# Patient Record
Sex: Female | Born: 1960 | Race: White | Hispanic: No | Marital: Married | Smoking: Former smoker
Health system: Southern US, Community
[De-identification: ages and names within clinical notes are randomized; demographics above are authoritative.]

## PROBLEM LIST (undated history)

## (undated) DIAGNOSIS — E079 Disorder of thyroid, unspecified: Secondary | ICD-10-CM

## (undated) DIAGNOSIS — F419 Anxiety disorder, unspecified: Secondary | ICD-10-CM

## (undated) DIAGNOSIS — R112 Nausea with vomiting, unspecified: Secondary | ICD-10-CM

## (undated) DIAGNOSIS — G4730 Sleep apnea, unspecified: Secondary | ICD-10-CM

## (undated) DIAGNOSIS — I10 Essential (primary) hypertension: Secondary | ICD-10-CM

## (undated) DIAGNOSIS — E039 Hypothyroidism, unspecified: Secondary | ICD-10-CM

## (undated) DIAGNOSIS — K219 Gastro-esophageal reflux disease without esophagitis: Secondary | ICD-10-CM

## (undated) DIAGNOSIS — J45909 Unspecified asthma, uncomplicated: Secondary | ICD-10-CM

## (undated) DIAGNOSIS — Z9889 Other specified postprocedural states: Secondary | ICD-10-CM

## (undated) HISTORY — PX: EYE SURGERY: SHX253

## (undated) HISTORY — DX: Disorder of thyroid, unspecified: E07.9

## (undated) HISTORY — PX: ROTATOR CUFF REPAIR: SHX139

## (undated) HISTORY — PX: ABDOMINAL HYSTERECTOMY: SHX81

## (undated) HISTORY — PX: TOTAL KNEE ARTHROPLASTY: SHX125

## (undated) HISTORY — PX: CHOLECYSTECTOMY: SHX55

## (undated) HISTORY — PX: JOINT REPLACEMENT: SHX530

## (undated) HISTORY — PX: COLONOSCOPY: SHX174

## (undated) HISTORY — PX: CARPAL TUNNEL RELEASE: SHX101

---

## 2004-08-25 HISTORY — PX: NASAL SINUS SURGERY: SHX719

## 2005-08-25 HISTORY — PX: OSTEOTOMY: SHX137

## 2017-12-29 ENCOUNTER — Other Ambulatory Visit: Payer: Self-pay

## 2017-12-29 ENCOUNTER — Encounter (HOSPITAL_BASED_OUTPATIENT_CLINIC_OR_DEPARTMENT_OTHER): Payer: Self-pay | Admitting: Emergency Medicine

## 2017-12-29 ENCOUNTER — Emergency Department (HOSPITAL_BASED_OUTPATIENT_CLINIC_OR_DEPARTMENT_OTHER)

## 2017-12-29 ENCOUNTER — Emergency Department (HOSPITAL_BASED_OUTPATIENT_CLINIC_OR_DEPARTMENT_OTHER)
Admission: EM | Admit: 2017-12-29 | Discharge: 2017-12-29 | Disposition: A | Discharge status: Home / Self Care | Attending: Emergency Medicine | Admitting: Emergency Medicine

## 2017-12-29 DIAGNOSIS — F419 Anxiety disorder, unspecified: Secondary | ICD-10-CM | POA: Diagnosis not present

## 2017-12-29 DIAGNOSIS — Z9049 Acquired absence of other specified parts of digestive tract: Secondary | ICD-10-CM | POA: Diagnosis not present

## 2017-12-29 DIAGNOSIS — R519 Headache, unspecified: Secondary | ICD-10-CM

## 2017-12-29 DIAGNOSIS — R51 Headache: Secondary | ICD-10-CM | POA: Insufficient documentation

## 2017-12-29 DIAGNOSIS — J392 Other diseases of pharynx: Secondary | ICD-10-CM

## 2017-12-29 DIAGNOSIS — R070 Pain in throat: Secondary | ICD-10-CM | POA: Diagnosis not present

## 2017-12-29 DIAGNOSIS — R112 Nausea with vomiting, unspecified: Secondary | ICD-10-CM | POA: Diagnosis present

## 2017-12-29 DIAGNOSIS — I10 Essential (primary) hypertension: Secondary | ICD-10-CM | POA: Insufficient documentation

## 2017-12-29 DIAGNOSIS — Z77098 Contact with and (suspected) exposure to other hazardous, chiefly nonmedicinal, chemicals: Secondary | ICD-10-CM | POA: Diagnosis not present

## 2017-12-29 DIAGNOSIS — J45909 Unspecified asthma, uncomplicated: Secondary | ICD-10-CM | POA: Insufficient documentation

## 2017-12-29 HISTORY — DX: Essential (primary) hypertension: I10

## 2017-12-29 HISTORY — DX: Unspecified asthma, uncomplicated: J45.909

## 2017-12-29 HISTORY — DX: Anxiety disorder, unspecified: F41.9

## 2017-12-29 MED ORDER — METOCLOPRAMIDE HCL 5 MG/ML IJ SOLN
10.0000 mg | Freq: Once | INTRAMUSCULAR | Status: AC
  Administered 2017-12-29: 10 mg via INTRAVENOUS
  Filled 2017-12-29: qty 2

## 2017-12-29 MED ORDER — DEXAMETHASONE SODIUM PHOSPHATE 10 MG/ML IJ SOLN
10.0000 mg | Freq: Once | INTRAMUSCULAR | Status: AC
  Administered 2017-12-29: 10 mg via INTRAVENOUS
  Filled 2017-12-29: qty 1

## 2017-12-29 MED ORDER — KETOROLAC TROMETHAMINE 30 MG/ML IJ SOLN
30.0000 mg | Freq: Once | INTRAMUSCULAR | Status: AC
Start: 2017-12-29 — End: 2017-12-29
  Administered 2017-12-29: 30 mg via INTRAVENOUS
  Filled 2017-12-29: qty 1

## 2017-12-29 MED ORDER — SODIUM CHLORIDE 0.9 % IV BOLUS
1000.0000 mL | Freq: Once | INTRAVENOUS | Status: AC
  Administered 2017-12-29: 1000 mL via INTRAVENOUS

## 2017-12-29 MED ORDER — DIPHENHYDRAMINE HCL 50 MG/ML IJ SOLN
25.0000 mg | Freq: Once | INTRAMUSCULAR | Status: AC
  Administered 2017-12-29: 25 mg via INTRAVENOUS
  Filled 2017-12-29: qty 1

## 2017-12-29 NOTE — ED Notes (Signed)
IV d/c tip intact, right a/c, pt tolerated well.

## 2017-12-29 NOTE — ED Provider Notes (Signed)
Received patient in signout from Dr. Preston Fleeting.  Briefly the patient is a 57 year old female with a chief complaint of an exposure to a cleaning product with an active ingredient of bleach.  She has a chest x-ray that is negative for concerning findings is oxygenating well.  She was given a headache cocktail with improvement of her symptoms.  She is requesting to be discharged home.  PCP follow-up.   Melene Plan, DO 12/29/17 [phone]

## 2017-12-29 NOTE — ED Provider Notes (Signed)
[facility] EMERGENCY DEPARTMENT Provider Note   CSN: [id number] Arrival date & time: 12/29/17  0545     History   Chief Complaint No chief complaint on file.   HPI Emma [name] is a 57 y.o. female.  The history is provided by the patient.  She has a history of hypertension, asthma, anxiety and comes in complaining of a reaction to using kaboom with bleach.  She states that yesterday morning, she was cleaning her bathroom using kaboom with bleach when she started to get lightheaded and developed nausea.  She did vomit.  As a day is gone on, she has developed pain in her face, ears, throat.  There is a dry cough.  She complains of a global headache.  She continues to have nausea.  She currently rates pain at 9/10.  She has not had anything at home to treat her symptoms.  Past Medical History:  Diagnosis Date  . Anxiety   . Asthma   . Hypertension     There are no active problems to display for this patient.   Past Surgical History:  Procedure Laterality Date  . ABDOMINAL HYSTERECTOMY    . CHOLECYSTECTOMY    . JOINT REPLACEMENT       OB History   None      Home Medications    Prior to Admission medications   Not on File    Family History No family history on file.  Social History Social History   Tobacco Use  . Smoking status: Never Smoker  . Smokeless tobacco: Never Used  Substance Use Topics  . Alcohol use: Never    Frequency: Never  . Drug use: Never     Allergies   Codeine   Review of Systems Review of Systems  All other systems reviewed and are negative.    Physical Exam Updated Vital Signs BP 100/64   Pulse (!) 103   Temp 99.7 F (37.6 C) (Oral)   Resp (!) 22   Ht  (1.575 m)   Wt 79.4 kg (175 lb)   SpO2 97%   BMI 32.01 kg/m   Physical Exam  Nursing note and vitals reviewed.  57 year old female, resting comfortably and in no acute distress. Vital signs are significant for mildly elevated heart rate and  respiratory rate. Oxygen saturation is 97%, which is normal. Head is normocephalic and atraumatic. PERRLA, EOMI. Oropharynx is clear.  There is no pooling of secretions.  Phonation is normal.  No edema of uvula or soft palate or tongue.  Slight swelling of the soft tissues of the face is noted. Neck is nontender and supple without adenopathy or JVD. Back is nontender and there is no CVA tenderness. Lungs are clear without rales, wheezes, or rhonchi. Chest is nontender. Heart has regular rate and rhythm without murmur. Abdomen is soft, flat, nontender without masses or hepatosplenomegaly and peristalsis is normoactive. Extremities have no cyanosis or edema, full range of motion is present. Skin is warm and dry without rash. Neurologic: Mental status is normal, cranial nerves are intact, there are no motor or sensory deficits.  ED Treatments / Results  Labs (all labs ordered are listed, but only abnormal results are displayed) Labs Reviewed - No data to display  EKG None  Radiology No results found.  Procedures Procedures   Medications Ordered in ED Medications  ketorolac (TORADOL) 30 MG/ML injection 30 mg (has no administration in time range)  metoCLOPramide (REGLAN) injection 10 mg (has no administration  in time range)  diphenhydrAMINE (BENADRYL) injection 25 mg (has no administration in time range)  dexamethasone (DECADRON) injection 10 mg (has no administration in time range)  sodium chloride 0.9 % bolus 1,000 mL (has no administration in time range)     Initial Impression / Assessment and Plan / ED Course  I have reviewed the triage vital signs and the nursing notes.  Pertinent labs & imaging results that were available during my care of the patient were reviewed by me and considered in my medical decision making (see chart for details).  Exposure to toxic fumes with bleach being the most likely agent causing difficulty.  Evidence of dermatitis on exam.  Old records are  reviewed, and she has no relevant past visits.  We will give IV fluids, ondansetron for nausea, metoclopramide, diphenhydramine, dexamethasone.  Chest x-ray is obtained to rule out pneumonia.  Old records are reviewed, and she has no relevant past visits.  6:53 AM Chest x-ray shows no obvious infiltrate per my reading, radiology interpretation pending.  Patient is just getting her medications now.  Case is signed out to Dr. Adela Lank.  Final Clinical Impressions(s) / ED Diagnoses   Final diagnoses:  Accidental exposure to bleach  Pain, head and face  Throat irritation  Bad headache    ED Discharge Orders    None       Dione Booze, MD 12/29/17 2229

## 2017-12-29 NOTE — Discharge Instructions (Signed)
Take 4 over the counter ibuprofen tablets 3 times a day or 2 over-the-counter naproxen tablets twice a day for pain. Also take tylenol 1000mg(2 extra strength) four times a day.    

## 2017-12-29 NOTE — ED Triage Notes (Addendum)
Was cleaning the tub, yesterday am , around 0900 and was cleaning with Kaboom and inhaled the fumes . Feels like she is burning in her ears, throat and H/A .

## 2019-03-03 NOTE — H&P (Signed)
[facility] [address] [place], [place] [address] [phone]  A Division of [facility]  RE: Emma [name], [name]                                  [id number]         DOB: 15-Sep-1960 02/28/2019  Reason for visit:  Followup left shoulder pain.    HPI: She suffered a fall in September and has had persistent pain.  She has had soreness around her left shoulder.  She has a history of right shoulder arthroscopy that had a surprise rotator cuff tear.  She has tried multiple injections in the left shoulder, as well as home exercise program.  She has not found any relief.    OBJECTIVE: The patient is a well appearing female, in no apparent distress.  Tenderness at her distal clavicle, as well as over her biceps tendon.    IMAGES: On MRI she has type III acromion with edema in the subacromial bursa and some tendinosis in this region as well.  No obvious rotator cuff tearing.  Some signal at her [facility] joint and around her biceps tendon.    ASSESSMENT/PLAN:  Left shoulder pain, recalcitrant to conservative measures.  We discussed continued physical therapy versus arthroscopic subacromial decompression, distal clavicle excision, exam of rotator cuff with possible repair and biceps tenodesis.  She verbalizes understanding and would like to proceed with surgery.   Ernesta Amble.  Percell Miller, M.D.  Electronically verified by Ernesta Amble. Percell Miller, M.D. TDM:pmw D 03/01/19 T 03/02/19

## 2019-03-22 ENCOUNTER — Ambulatory Visit: Admitting: Licensed Clinical Social Worker

## 2019-03-24 ENCOUNTER — Other Ambulatory Visit: Payer: Self-pay

## 2019-03-24 ENCOUNTER — Encounter (HOSPITAL_BASED_OUTPATIENT_CLINIC_OR_DEPARTMENT_OTHER): Payer: Self-pay | Admitting: *Deleted

## 2019-03-29 ENCOUNTER — Other Ambulatory Visit (HOSPITAL_COMMUNITY)
Admission: RE | Admit: 2019-03-29 | Discharge: 2019-03-29 | Disposition: A | Discharge status: Home / Self Care | Source: Ambulatory Visit | Attending: Orthopedic Surgery | Admitting: Orthopedic Surgery

## 2019-03-29 ENCOUNTER — Other Ambulatory Visit: Payer: Self-pay

## 2019-03-29 ENCOUNTER — Encounter (HOSPITAL_BASED_OUTPATIENT_CLINIC_OR_DEPARTMENT_OTHER)
Admission: RE | Admit: 2019-03-29 | Discharge: 2019-03-29 | Disposition: A | Discharge status: Home / Self Care | Source: Ambulatory Visit | Attending: Orthopedic Surgery | Admitting: Orthopedic Surgery

## 2019-03-29 DIAGNOSIS — I10 Essential (primary) hypertension: Secondary | ICD-10-CM | POA: Diagnosis not present

## 2019-03-29 DIAGNOSIS — W19XXXA Unspecified fall, initial encounter: Secondary | ICD-10-CM | POA: Diagnosis not present

## 2019-03-29 DIAGNOSIS — K219 Gastro-esophageal reflux disease without esophagitis: Secondary | ICD-10-CM | POA: Diagnosis not present

## 2019-03-29 DIAGNOSIS — M24112 Other articular cartilage disorders, left shoulder: Secondary | ICD-10-CM | POA: Diagnosis not present

## 2019-03-29 DIAGNOSIS — Z01812 Encounter for preprocedural laboratory examination: Secondary | ICD-10-CM | POA: Diagnosis not present

## 2019-03-29 DIAGNOSIS — Z01810 Encounter for preprocedural cardiovascular examination: Secondary | ICD-10-CM | POA: Diagnosis not present

## 2019-03-29 DIAGNOSIS — M75122 Complete rotator cuff tear or rupture of left shoulder, not specified as traumatic: Secondary | ICD-10-CM | POA: Insufficient documentation

## 2019-03-29 DIAGNOSIS — M19012 Primary osteoarthritis, left shoulder: Secondary | ICD-10-CM | POA: Insufficient documentation

## 2019-03-29 DIAGNOSIS — Z20828 Contact with and (suspected) exposure to other viral communicable diseases: Secondary | ICD-10-CM | POA: Insufficient documentation

## 2019-03-29 DIAGNOSIS — F419 Anxiety disorder, unspecified: Secondary | ICD-10-CM | POA: Diagnosis not present

## 2019-03-29 DIAGNOSIS — J45909 Unspecified asthma, uncomplicated: Secondary | ICD-10-CM | POA: Diagnosis not present

## 2019-03-29 DIAGNOSIS — Z01818 Encounter for other preprocedural examination: Secondary | ICD-10-CM | POA: Insufficient documentation

## 2019-03-29 DIAGNOSIS — S46012A Strain of muscle(s) and tendon(s) of the rotator cuff of left shoulder, initial encounter: Secondary | ICD-10-CM | POA: Diagnosis not present

## 2019-03-29 DIAGNOSIS — Z79899 Other long term (current) drug therapy: Secondary | ICD-10-CM | POA: Diagnosis not present

## 2019-03-29 LAB — BASIC METABOLIC PANEL
Anion gap: 11 (ref 5–15)
BUN: 12 mg/dL (ref 6–20)
CO2: 26 mmol/L (ref 22–32)
Calcium: 9.4 mg/dL (ref 8.9–10.3)
Chloride: 97 mmol/L — ABNORMAL LOW (ref 98–111)
Creatinine, Ser: 0.69 mg/dL (ref 0.44–1.00)
GFR calc Af Amer: >60 mL/min (ref >60)
GFR calc non Af Amer: >60 mL/min (ref >60)
Glucose, Bld: 94 mg/dL (ref 70–99)
Potassium: 3.9 mmol/L (ref 3.5–5.1)
Sodium: 134 mmol/L — ABNORMAL LOW (ref 135–145)

## 2019-03-29 LAB — SARS CORONAVIRUS 2 (TAT 6-24 HRS): SARS Coronavirus 2: NEGATIVE

## 2019-03-29 NOTE — Progress Notes (Signed)

## 2019-04-01 ENCOUNTER — Encounter (HOSPITAL_BASED_OUTPATIENT_CLINIC_OR_DEPARTMENT_OTHER): Payer: Self-pay | Admitting: *Deleted

## 2019-04-01 ENCOUNTER — Encounter (HOSPITAL_BASED_OUTPATIENT_CLINIC_OR_DEPARTMENT_OTHER)
Admission: RE | Disposition: A | Discharge status: Home / Self Care | Payer: Self-pay | Source: Home / Self Care | Attending: Orthopedic Surgery

## 2019-04-01 ENCOUNTER — Ambulatory Visit (HOSPITAL_BASED_OUTPATIENT_CLINIC_OR_DEPARTMENT_OTHER): Admitting: Anesthesiology

## 2019-04-01 ENCOUNTER — Ambulatory Visit (HOSPITAL_BASED_OUTPATIENT_CLINIC_OR_DEPARTMENT_OTHER)
Admission: RE | Admit: 2019-04-01 | Discharge: 2019-04-01 | Disposition: A | Discharge status: Home / Self Care | Attending: Orthopedic Surgery | Admitting: Orthopedic Surgery

## 2019-04-01 ENCOUNTER — Other Ambulatory Visit: Payer: Self-pay

## 2019-04-01 DIAGNOSIS — W19XXXA Unspecified fall, initial encounter: Secondary | ICD-10-CM | POA: Insufficient documentation

## 2019-04-01 DIAGNOSIS — S46012A Strain of muscle(s) and tendon(s) of the rotator cuff of left shoulder, initial encounter: Secondary | ICD-10-CM | POA: Insufficient documentation

## 2019-04-01 DIAGNOSIS — J45909 Unspecified asthma, uncomplicated: Secondary | ICD-10-CM | POA: Insufficient documentation

## 2019-04-01 DIAGNOSIS — Z79899 Other long term (current) drug therapy: Secondary | ICD-10-CM | POA: Insufficient documentation

## 2019-04-01 DIAGNOSIS — M75112 Incomplete rotator cuff tear or rupture of left shoulder, not specified as traumatic: Secondary | ICD-10-CM

## 2019-04-01 DIAGNOSIS — Z01810 Encounter for preprocedural cardiovascular examination: Secondary | ICD-10-CM | POA: Insufficient documentation

## 2019-04-01 DIAGNOSIS — F419 Anxiety disorder, unspecified: Secondary | ICD-10-CM | POA: Insufficient documentation

## 2019-04-01 DIAGNOSIS — I10 Essential (primary) hypertension: Secondary | ICD-10-CM | POA: Insufficient documentation

## 2019-04-01 DIAGNOSIS — Z01812 Encounter for preprocedural laboratory examination: Secondary | ICD-10-CM | POA: Insufficient documentation

## 2019-04-01 DIAGNOSIS — M19012 Primary osteoarthritis, left shoulder: Secondary | ICD-10-CM | POA: Insufficient documentation

## 2019-04-01 DIAGNOSIS — K219 Gastro-esophageal reflux disease without esophagitis: Secondary | ICD-10-CM | POA: Insufficient documentation

## 2019-04-01 DIAGNOSIS — Z20828 Contact with and (suspected) exposure to other viral communicable diseases: Secondary | ICD-10-CM | POA: Insufficient documentation

## 2019-04-01 DIAGNOSIS — M24112 Other articular cartilage disorders, left shoulder: Secondary | ICD-10-CM | POA: Insufficient documentation

## 2019-04-01 HISTORY — PX: SHOULDER ARTHROSCOPY WITH SUBACROMIAL DECOMPRESSION, ROTATOR CUFF REPAIR AND BICEP TENDON REPAIR: SHX5687

## 2019-04-01 HISTORY — DX: Nausea with vomiting, unspecified: R11.2

## 2019-04-01 HISTORY — DX: Sleep apnea, unspecified: G47.30

## 2019-04-01 HISTORY — DX: Gastro-esophageal reflux disease without esophagitis: K21.9

## 2019-04-01 HISTORY — DX: Other specified postprocedural states: Z98.890

## 2019-04-01 SURGERY — SHOULDER ARTHROSCOPY WITH SUBACROMIAL DECOMPRESSION, ROTATOR CUFF REPAIR AND BICEP TENDON REPAIR
Anesthesia: General | Site: Shoulder | Laterality: Left

## 2019-04-01 MED ORDER — FENTANYL CITRATE (PF) 100 MCG/2ML IJ SOLN: 25.0000 ug | INTRAMUSCULAR | Status: DC | PRN

## 2019-04-01 MED ORDER — CEFAZOLIN SODIUM-DEXTROSE 2-4 GM/100ML-% IV SOLN
2.0000 g | INTRAVENOUS | Status: AC
Start: 2019-04-01 — End: 2019-04-01
  Administered 2019-04-01: 2 g via INTRAVENOUS

## 2019-04-01 MED ORDER — SODIUM CHLORIDE 0.9 % IR SOLN
Status: DC | PRN
  Administered 2019-04-01: 18000 mL

## 2019-04-01 MED ORDER — GABAPENTIN 300 MG PO CAPS
300.0000 mg | ORAL_CAPSULE | Freq: Once | ORAL | Status: AC
  Administered 2019-04-01: 300 mg via ORAL

## 2019-04-01 MED ORDER — ONDANSETRON HCL 4 MG/2ML IJ SOLN
INTRAMUSCULAR | Status: DC | PRN
  Administered 2019-04-01: 4 mg via INTRAVENOUS

## 2019-04-01 MED ORDER — DEXAMETHASONE SODIUM PHOSPHATE 10 MG/ML IJ SOLN
INTRAMUSCULAR | Status: AC
  Filled 2019-04-01: qty 1

## 2019-04-01 MED ORDER — METHYLPREDNISOLONE ACETATE 80 MG/ML IJ SUSP
INTRAMUSCULAR | Status: DC | PRN
  Administered 2019-04-01: 80 mg via INTRA_ARTICULAR

## 2019-04-01 MED ORDER — OXYCODONE HCL 5 MG PO TABS: 5.0000 mg | ORAL_TABLET | ORAL | 0 refills | Status: AC | PRN

## 2019-04-01 MED ORDER — CHLORHEXIDINE GLUCONATE 4 % EX LIQD: 60.0000 mL | Freq: Once | CUTANEOUS | Status: DC

## 2019-04-01 MED ORDER — BUPIVACAINE HCL (PF) 0.25 % IJ SOLN
INTRAMUSCULAR | Status: DC | PRN
  Administered 2019-04-01: 15 mL

## 2019-04-01 MED ORDER — BUPIVACAINE HCL (PF) 0.25 % IJ SOLN
INTRAMUSCULAR | Status: DC | PRN
  Administered 2019-04-01: 5 mL via INTRA_ARTICULAR

## 2019-04-01 MED ORDER — LACTATED RINGERS IV SOLN: INTRAVENOUS | Status: DC

## 2019-04-01 MED ORDER — LIDOCAINE 2% (20 MG/ML) 5 ML SYRINGE
INTRAMUSCULAR | Status: AC
  Filled 2019-04-01: qty 5

## 2019-04-01 MED ORDER — CELECOXIB 400 MG PO CAPS
400.0000 mg | ORAL_CAPSULE | Freq: Once | ORAL | Status: AC
  Administered 2019-04-01: 400 mg via ORAL

## 2019-04-01 MED ORDER — FENTANYL CITRATE (PF) 100 MCG/2ML IJ SOLN
INTRAMUSCULAR | Status: AC
  Filled 2019-04-01: qty 2

## 2019-04-01 MED ORDER — ACETAMINOPHEN 500 MG PO TABS: 1000.0000 mg | ORAL_TABLET | Freq: Once | ORAL | Status: DC

## 2019-04-01 MED ORDER — BUPIVACAINE LIPOSOME 1.3 % IJ SUSP
INTRAMUSCULAR | Status: DC | PRN
  Administered 2019-04-01: 10 mL via PERINEURAL

## 2019-04-01 MED ORDER — CELECOXIB 200 MG PO CAPS
ORAL_CAPSULE | ORAL | Status: AC
  Filled 2019-04-01: qty 2

## 2019-04-01 MED ORDER — DEXAMETHASONE SODIUM PHOSPHATE 10 MG/ML IJ SOLN
INTRAMUSCULAR | Status: DC | PRN
  Administered 2019-04-01: 10 mg via INTRAVENOUS

## 2019-04-01 MED ORDER — LIDOCAINE 2% (20 MG/ML) 5 ML SYRINGE
INTRAMUSCULAR | Status: DC | PRN
  Administered 2019-04-01: 40 mg via INTRAVENOUS

## 2019-04-01 MED ORDER — SUCCINYLCHOLINE CHLORIDE 200 MG/10ML IV SOSY
PREFILLED_SYRINGE | INTRAVENOUS | Status: DC | PRN
  Administered 2019-04-01: 120 mg via INTRAVENOUS

## 2019-04-01 MED ORDER — ONDANSETRON HCL 4 MG PO TABS: 4.0000 mg | ORAL_TABLET | Freq: Three times a day (TID) | ORAL | 0 refills | Status: DC | PRN

## 2019-04-01 MED ORDER — ONDANSETRON HCL 4 MG/2ML IJ SOLN
INTRAMUSCULAR | Status: AC
  Filled 2019-04-01: qty 2

## 2019-04-01 MED ORDER — ACETAMINOPHEN 500 MG PO TABS: 1000.0000 mg | ORAL_TABLET | Freq: Three times a day (TID) | ORAL | 0 refills | Status: AC

## 2019-04-01 MED ORDER — ACETAMINOPHEN 500 MG PO TABS
1000.0000 mg | ORAL_TABLET | Freq: Once | ORAL | Status: AC
  Administered 2019-04-01: 11:00:00 1000 mg via ORAL

## 2019-04-01 MED ORDER — LACTATED RINGERS IV SOLN
INTRAVENOUS | Status: DC
  Administered 2019-04-01 (×2): via INTRAVENOUS

## 2019-04-01 MED ORDER — GABAPENTIN 300 MG PO CAPS
ORAL_CAPSULE | ORAL | Status: AC
  Filled 2019-04-01: qty 1

## 2019-04-01 MED ORDER — CEFAZOLIN SODIUM-DEXTROSE 2-4 GM/100ML-% IV SOLN
INTRAVENOUS | Status: AC
  Filled 2019-04-01: qty 100

## 2019-04-01 MED ORDER — PROMETHAZINE HCL 25 MG/ML IJ SOLN: 6.2500 mg | INTRAMUSCULAR | Status: DC | PRN

## 2019-04-01 MED ORDER — MIDAZOLAM HCL 2 MG/2ML IJ SOLN
1.0000 mg | INTRAMUSCULAR | Status: DC | PRN
  Administered 2019-04-01: 2 mg via INTRAVENOUS

## 2019-04-01 MED ORDER — METHOCARBAMOL 500 MG PO TABS: 500.0000 mg | ORAL_TABLET | Freq: Three times a day (TID) | ORAL | 0 refills | Status: DC | PRN

## 2019-04-01 MED ORDER — PROPOFOL 10 MG/ML IV BOLUS
INTRAVENOUS | Status: AC
  Filled 2019-04-01: qty 20

## 2019-04-01 MED ORDER — GABAPENTIN 300 MG PO CAPS: 300.0000 mg | ORAL_CAPSULE | Freq: Two times a day (BID) | ORAL | 0 refills | Status: DC

## 2019-04-01 MED ORDER — SUCCINYLCHOLINE CHLORIDE 200 MG/10ML IV SOSY
PREFILLED_SYRINGE | INTRAVENOUS | Status: AC
  Filled 2019-04-01: qty 10

## 2019-04-01 MED ORDER — PHENYLEPHRINE 40 MCG/ML (10ML) SYRINGE FOR IV PUSH (FOR BLOOD PRESSURE SUPPORT)
PREFILLED_SYRINGE | INTRAVENOUS | Status: DC | PRN
  Administered 2019-04-01 (×2): 80 ug via INTRAVENOUS
  Administered 2019-04-01: 40 ug via INTRAVENOUS
  Administered 2019-04-01: 80 ug via INTRAVENOUS
  Administered 2019-04-01: 120 ug via INTRAVENOUS
  Administered 2019-04-01: 80 ug via INTRAVENOUS

## 2019-04-01 MED ORDER — CIPROFLOXACIN IN D5W 400 MG/200ML IV SOLN
INTRAVENOUS | Status: AC
  Filled 2019-04-01: qty 200

## 2019-04-01 MED ORDER — PHENYLEPHRINE 40 MCG/ML (10ML) SYRINGE FOR IV PUSH (FOR BLOOD PRESSURE SUPPORT)
PREFILLED_SYRINGE | INTRAVENOUS | Status: AC
  Filled 2019-04-01: qty 20

## 2019-04-01 MED ORDER — PROPOFOL 10 MG/ML IV BOLUS
INTRAVENOUS | Status: DC | PRN
  Administered 2019-04-01: 200 mg via INTRAVENOUS

## 2019-04-01 MED ORDER — MIDAZOLAM HCL 2 MG/2ML IJ SOLN
INTRAMUSCULAR | Status: AC
  Filled 2019-04-01: qty 2

## 2019-04-01 MED ORDER — FENTANYL CITRATE (PF) 100 MCG/2ML IJ SOLN
100.0000 ug | INTRAMUSCULAR | Status: DC | PRN
  Administered 2019-04-01: 100 ug via INTRAVENOUS

## 2019-04-01 MED ORDER — ACETAMINOPHEN 500 MG PO TABS
ORAL_TABLET | ORAL | Status: AC
  Filled 2019-04-01: qty 2

## 2019-04-01 SURGICAL SUPPLY — 76 items
ANCHOR SUT BIO SW 4.75X19.1 (Anchor) ×2 IMPLANT
ANCHOR SUT BIOCOMP LK 2.9X12.5 (Anchor) ×2 IMPLANT
BLADE EXCALIBUR 4.0MM X 13CM (MISCELLANEOUS)
BLADE EXCALIBUR 4.0X13 (MISCELLANEOUS) IMPLANT
BLADE SURG 15 STRL LF DISP TIS (BLADE) IMPLANT
BLADE SURG 15 STRL SS (BLADE)
BUR OVAL 4.0 (BURR) IMPLANT
BURR OVAL 8 FLU 5.0MM X 13CM (MISCELLANEOUS) ×1
BURR OVAL 8 FLU 5.0X13 (MISCELLANEOUS) ×2 IMPLANT
CANNULA 5.75X71 LONG (CANNULA) IMPLANT
CANNULA TWIST IN 8.25X7CM (CANNULA) ×2 IMPLANT
CHLORAPREP W/TINT 26 (MISCELLANEOUS) ×3 IMPLANT
CLOSURE STERI-STRIP 1/2X4 (GAUZE/BANDAGES/DRESSINGS)
CLSR STERI-STRIP ANTIMIC 1/2X4 (GAUZE/BANDAGES/DRESSINGS) IMPLANT
COVER WAND RF STERILE (DRAPES) IMPLANT
DISSECTOR  3.8MM X 13CM (MISCELLANEOUS) ×2
DISSECTOR 3.8MM X 13CM (MISCELLANEOUS) ×1 IMPLANT
DRAPE IMP U-DRAPE 54X76 (DRAPES) ×3 IMPLANT
DRAPE INCISE IOBAN 66X45 STRL (DRAPES) ×3 IMPLANT
DRAPE SHOULDER BEACH CHAIR (DRAPES) ×3 IMPLANT
DRAPE U-SHAPE 47X51 STRL (DRAPES) ×3 IMPLANT
DRSG EMULSION OIL 3X3 NADH (GAUZE/BANDAGES/DRESSINGS) ×3 IMPLANT
DRSG PAD ABDOMINAL 8X10 ST (GAUZE/BANDAGES/DRESSINGS) ×3 IMPLANT
ELECT REM PT RETURN 9FT ADLT (ELECTROSURGICAL)
ELECTRODE REM PT RTRN 9FT ADLT (ELECTROSURGICAL) IMPLANT
GAUZE SPONGE 4X4 12PLY STRL (GAUZE/BANDAGES/DRESSINGS) ×4 IMPLANT
GLOVE BIO SURGEON STRL SZ7.5 (GLOVE) ×6 IMPLANT
GLOVE BIOGEL PI IND STRL 8 (GLOVE) ×2 IMPLANT
GLOVE BIOGEL PI INDICATOR 8 (GLOVE) ×4
GOWN STRL REUS W/ TWL LRG LVL3 (GOWN DISPOSABLE) ×3 IMPLANT
GOWN STRL REUS W/ TWL XL LVL3 (GOWN DISPOSABLE) IMPLANT
GOWN STRL REUS W/TWL LRG LVL3 (GOWN DISPOSABLE) ×6
GOWN STRL REUS W/TWL XL LVL3 (GOWN DISPOSABLE)
KIT PUSHLOCK 2.9 HIP (KITS) ×2 IMPLANT
MANIFOLD NEPTUNE II (INSTRUMENTS) ×3 IMPLANT
NDL SCORPION MULTI FIRE (NEEDLE) IMPLANT
NDL SUT 6 .5 CRC .975X.05 MAYO (NEEDLE) IMPLANT
NEEDLE MAYO TAPER (NEEDLE)
NEEDLE SCORPION MULTI FIRE (NEEDLE) ×3 IMPLANT
NS IRRIG 1000ML POUR BTL (IV SOLUTION) IMPLANT
PACK ARTHROSCOPY DSU (CUSTOM PROCEDURE TRAY) ×3 IMPLANT
PACK BASIN DAY SURGERY FS (CUSTOM PROCEDURE TRAY) ×3 IMPLANT
PAD ORTHO SHOULDER 7X19 LRG (SOFTGOODS) ×2 IMPLANT
PASSER SUT SWIFTSTITCH HIP CRT (INSTRUMENTS) ×2 IMPLANT
PENCIL BUTTON HOLSTER BLD 10FT (ELECTRODE) IMPLANT
PORT APPOLLO RF 90DEGREE MULTI (SURGICAL WAND) ×3 IMPLANT
RESTRAINT HEAD UNIVERSAL NS (MISCELLANEOUS) ×3 IMPLANT
SLEEVE SCD COMPRESS KNEE MED (MISCELLANEOUS) ×3 IMPLANT
SLING ARM FOAM STRAP LRG (SOFTGOODS) IMPLANT
SLING ARM IMMOBILIZER LRG (SOFTGOODS) IMPLANT
SLING ARM IMMOBILIZER MED (SOFTGOODS) IMPLANT
SLING ARM MED ADULT FOAM STRAP (SOFTGOODS) IMPLANT
SLING ARM XL FOAM STRAP (SOFTGOODS) IMPLANT
SPONGE LAP 4X18 RFD (DISPOSABLE) IMPLANT
SUCTION FRAZIER HANDLE 10FR (MISCELLANEOUS)
SUCTION TUBE FRAZIER 10FR DISP (MISCELLANEOUS) IMPLANT
SUT ETHIBOND 2 OS 4 DA (SUTURE) IMPLANT
SUT ETHILON 2 0 FS 18 (SUTURE) IMPLANT
SUT ETHILON 3 0 PS 1 (SUTURE) ×3 IMPLANT
SUT FIBERWIRE #2 38 T-5 BLUE (SUTURE)
SUT MNCRL AB 4-0 PS2 18 (SUTURE) IMPLANT
SUT TIGER TAPE 7 IN WHITE (SUTURE) IMPLANT
SUT VIC AB 0 CT1 27 (SUTURE)
SUT VIC AB 0 CT1 27XBRD ANBCTR (SUTURE) IMPLANT
SUT VIC AB 2-0 SH 27 (SUTURE)
SUT VIC AB 2-0 SH 27XBRD (SUTURE) IMPLANT
SUT VIC AB 3-0 FS2 27 (SUTURE) IMPLANT
SUTURE FIBERWR #2 38 T-5 BLUE (SUTURE) IMPLANT
SUTURE TAPE TIGERLINK 1.3MM BL (SUTURE) IMPLANT
SUTURETAPE TIGERLINK 1.3MM BL (SUTURE)
TAPE CLOTH SURG 6X10 WHT LF (GAUZE/BANDAGES/DRESSINGS) ×2 IMPLANT
TAPE FIBER 2MM 7IN #2 BLUE (SUTURE) IMPLANT
TOWEL GREEN STERILE FF (TOWEL DISPOSABLE) ×3 IMPLANT
TUBING ARTHROSCOPY IRRIG 16FT (MISCELLANEOUS) ×3 IMPLANT
WATER STERILE IRR 1000ML POUR (IV SOLUTION) ×3 IMPLANT
YANKAUER SUCT BULB TIP NO VENT (SUCTIONS) IMPLANT

## 2019-04-01 NOTE — Anesthesia Postprocedure Evaluation (Signed)
Anesthesia Post Note  Patient: Emma [name]  Procedure(s) Performed: LEFT SHOULDER ARTHROSCOPY WITH SUBACROMIAL DECOMPRESSION, DISTAL CLAVICLE EXCISION, BICEP TENODESIS, ROTATOR CUFF REPAIR, spinal needle decrompression of glenoid cyst (Left Shoulder)     Patient location during evaluation: PACU Anesthesia Type: General Level of consciousness: sedated Pain management: pain level controlled Vital Signs Assessment: post-procedure vital signs reviewed and stable Respiratory status: spontaneous breathing and respiratory function stable Cardiovascular status: stable Postop Assessment: no apparent nausea or vomiting Anesthetic complications: no    Last Vitals:  Vitals:   04/01/19 1500 04/01/19 1551  BP: 118/67 137/79  Pulse: 87 91  Resp: 14 16  Temp:  36.8 C  SpO2: 98% 98%    Last Pain:  Vitals:   04/01/19 1551  PainSc: 0-No pain                 Emmert Roethler DANIEL

## 2019-04-01 NOTE — Op Note (Signed)
04/01/2019  1:43 PM  PATIENT:  Emma PatchLori [name]    PRE-OPERATIVE DIAGNOSIS:  OTHER ARTICULAR CARTILAGE DISORDERS, LEFT SHOULDER, PRIMARY OSTEOARTHRITIS, LEFT SHOULDER, OTHER ARTICULAR CARTILAGE DISORDERS, LEFT SHOULDER COMPLETE ROTATOR CUFF TEAR OR RUPTURE OF LEFT SHOULDER  POST-OPERATIVE DIAGNOSIS:  Same  PROCEDURE:  LEFT SHOULDER ARTHROSCOPY WITH SUBACROMIAL DECOMPRESSION, DISTAL CLAVICLE EXCISION, BICEP TENODESIS, ROTATOR CUFF REPAIR, spinal needle decrompression of glenoid cyst  SURGEON:  Sheral Apleyimothy D [facility], MD  ASSISTANT: Aquilla HackerHenry Martensen, PA-C, he was present and scrubbed throughout the case, critical for completion in a timely fashion, and for retraction, instrumentation, and closure.   ANESTHESIA:   General  PREOPERATIVE INDICATIONS:  Emma [name] is a  58 y.o. female with a diagnosis of OTHER ARTICULAR CARTILAGE DISORDERS, LEFT SHOULDER, PRIMARY OSTEOARTHRITIS, LEFT SHOULDER, OTHER ARTICULAR CARTILAGE DISORDERS, LEFT SHOULDER COMPLETE ROTATOR CUFF TEAR OR RUPTURE OF LEFT SHOULDER who failed conservative measures and elected for surgical management.    The risks benefits and alternatives were discussed with the patient preoperatively including but not limited to the risks of infection, bleeding, nerve injury, cardiopulmonary complications, the need for revision surgery, among others, and the patient was willing to proceed.  OPERATIVE IMPLANTS: arthrex anchors  OPERATIVE FINDINGS: Supra tear, SLAP tear  BLOOD LOSS: minimal  COMPLICATIONS: none  OPERATIVE PROCEDURE:  Patient was identified in the preoperative holding area and site was marked by me He was transported to the operating theater and placed on the table in beach chair position taking care to pad all bony prominences. After a preincinduction time out anesthesia was induced. The left upper extremity was prepped and draped in normal sterile fashion and a pre-incision timeout was performed. Emma [name] received ancef for preoperative  antibiotics.   Initially made a posterior arthroscopic portal and inserted the arthroscope into the glenohumeral joint. tour of the joint demonstrated the above operative findings  I created an anterior portal just lateral to the coracoid under direct visualization using a spinal needle.  I performed an extensive debridement of the scarred synovial tissue and remaining structures   I used a combination of biter and shaver to release the biceps tendon from the superior labrum and then used the shaver to debride the superior labrum to a smooth rim. I performed a L&T tenodesis  I then introduced the arthroscope into the subacromial space and brought the shaver into the anterior portal. I debrided the bursa for appropriate visualization.  I then performed a subacromial decompression using combination of the shaver ArthroCare and burr using a cutting block technique. As happy with the final elevation of the subacromial space on multiple portal views.   Next I turned my attention to the distal clavicle and through the anterior portal using the bur and shaver I was able to perform a distal clavicle excision. I then switched portals and inserted the arthroscope into the anterior portal and was happy with an appropriate resection of the distal clavicle.   I debrided the rotator cuff tear and examined its mobility. There was a roughly 6 millimeters tear and I debrided the tear here. I then debrided the footprint to good bone for placement of the tendon. Next I marked the spot for the push locks. I used the scorpion to pass a horizontal mattress stitch through the tendon with even spread. I placed this down to bone with good bite on the SL anchor.  I was happy with the tendon apposition and there was minimal to no dog ear.  Next I removed all arthroscopic equipment expressed  all fluid and closed the portals with a nylon stitch. A sterile dressing was applied the patient was taken the PACU in stable  condition.  POST OPERATIVE PLAN: The patient will be in a sling full-time and keep the dressings clean dry and intact. DVT prophylaxis will consist of early ambulation

## 2019-04-01 NOTE — Transfer of Care (Signed)
Immediate Anesthesia Transfer of Care Note  Patient: Amamda Curbow  Procedure(s) Performed: LEFT SHOULDER ARTHROSCOPY WITH SUBACROMIAL DECOMPRESSION, DISTAL CLAVICLE EXCISION, BICEP TENODESIS, ROTATOR CUFF REPAIR, spinal needle decrompression of glenoid cyst (Left Shoulder)  Patient Location: PACU  Anesthesia Type:GA combined with regional for post-op pain  Level of Consciousness: awake, alert  and oriented  Airway & Oxygen Therapy: Patient Spontanous Breathing and Patient connected to face mask oxygen  Post-op Assessment: Report given to RN and Post -op Vital signs reviewed and stable  Post vital signs: Reviewed and stable  Last Vitals:  Vitals Value Taken Time  BP 134/67 04/01/19 1402  Temp    Pulse 80 04/01/19 1404  Resp 18 04/01/19 1404  SpO2 100 % 04/01/19 1404  Vitals shown include unvalidated device data.  Last Pain:  Vitals:   04/01/19 1043  PainSc: 3          Complications: No apparent anesthesia complications

## 2019-04-01 NOTE — Progress Notes (Signed)
Assisted Dr. Singer with left, ultrasound guided, interscalene  block. Side rails up, monitors on throughout procedure. See vital signs in flow sheet. Tolerated Procedure well.  

## 2019-04-01 NOTE — Interval H&P Note (Signed)
I participated in the care of this patient and agree with the above history, physical and evaluation. I performed a review of the history and a physical exam as detailed   Timothy Daniel Murphy MD  

## 2019-04-01 NOTE — Discharge Instructions (Signed)
Maintain sling until follow up.  Diet: As you were doing prior to hospitalization   Dressing:  Keep dressings on and dry.  You may remove dressings in 3 days and shower over incisions.  No Bath / submerging incisions.  Cover with clean Band-Aid.  Activity:  Increase activity slowly as tolerated, but follow the weight bearing instructions below.  The rules on driving is that you can not be taking narcotics while you drive, and you must feel in control of the vehicle.    Weight Bearing:  Do not bear weight with affected arm.   Pain:  For severe pain, you may increase breakthrough pain medication (oxycodone) for the first few days post op to 2 tablets every 4 hours.  Stop this medication as soon as you are able.  Constipation: Narcotic pain medications cause constipation.  Reduce use or stop taking if you become constipated.  Drink plenty of fluids (prune juice may be helpful) and high fiber foods.  You may use a stool softener such as -  Colace (over the counter) 100 mg by mouth twice a day  And/or Miralax (over the counter) for constipation as needed.    Itching:  If you experience itching with your medications, try taking only a single pain pill, or even half a pain pill at a time.  You can also use benadryl over the counter for itching or also to help with sleep.   Precautions:  If you experience chest pain or shortness of breath - call 911 immediately for transfer to the hospital emergency department!!  If you develop a fever greater that 101 F, purulent drainage from wound, increased redness or drainage from wound, or calf pain -- Call the office at [phone]                                                 Follow- Up Appointment:  Please call for an appointment to be seen in 2 weeks [place] - [phone]     Post Anesthesia Home Care Instructions  Activity: Get plenty of rest for the remainder of the day. A responsible individual must stay with you for 24 hours following  the procedure.  For the next 24 hours, DO NOT: -Drive a car -Paediatric nurse -Drink alcoholic beverages -Take any medication unless instructed by your physician -Make any legal decisions or sign important papers.  Meals: Start with liquid foods such as gelatin or soup. Progress to regular foods as tolerated. Avoid greasy, spicy, heavy foods. If nausea and/or vomiting occur, drink only clear liquids until the nausea and/or vomiting subsides. Call your physician if vomiting continues.  Special Instructions/Symptoms: Your throat may feel dry or sore from the anesthesia or the breathing tube placed in your throat during surgery. If this causes discomfort, gargle with warm salt water. The discomfort should disappear within 24 hours.  If you had a scopolamine patch placed behind your ear for the management of post- operative nausea and/or vomiting:  1. The medication in the patch is effective for 72 hours, after which it should be removed.  Wrap patch in a tissue and discard in the trash. Wash hands thoroughly with soap and water. 2. You may remove the patch earlier than 72 hours if you experience unpleasant side effects which may include dry mouth, dizziness or visual disturbances. 3. Avoid touching the patch. Wash  your hands with soap and water after contact with the patch.     Regional Anesthesia Blocks  1. Numbness or the inability to move the "blocked" extremity may last from 3-48 hours after placement. The length of time depends on the medication injected and your individual response to the medication. If the numbness is not going away after 48 hours, call your surgeon.  2. The extremity that is blocked will need to be protected until the numbness is gone and the  Strength has returned. Because you cannot feel it, you will need to take extra care to avoid injury. Because it may be weak, you may have difficulty moving it or using it. You may not know what position it is in without looking  at it while the block is in effect.  3. For blocks in the legs and feet, returning to weight bearing and walking needs to be done carefully. You will need to wait until the numbness is entirely gone and the strength has returned. You should be able to move your leg and foot normally before you try and bear weight or walk. You will need someone to be with you when you first try to ensure you do not fall and possibly risk injury.  4. Bruising and tenderness at the needle site are common side effects and will resolve in a few days.  5. Persistent numbness or new problems with movement should be communicated to the surgeon or the [facility]  [phone])/ [facility] [place] [phone]).

## 2019-04-01 NOTE — Anesthesia Procedure Notes (Signed)
Procedure Name: Intubation Date/Time: 04/01/2019 12:48 PM Performed by: Genelle Bal, CRNA Pre-anesthesia Checklist: Patient identified, Emergency Drugs available, Suction available and Patient being monitored Patient Re-evaluated:Patient Re-evaluated prior to induction Oxygen Delivery Method: Circle system utilized Preoxygenation: Pre-oxygenation with 100% oxygen Induction Type: IV induction Ventilation: Mask ventilation without difficulty Laryngoscope Size: Miller and 2 Grade View: Grade I Tube type: Oral Number of attempts: 1 Airway Equipment and Method: Stylet and Oral airway Placement Confirmation: ETT inserted through vocal cords under direct vision,  positive ETCO2 and breath sounds checked- equal and bilateral Secured at: 22 cm Tube secured with: Tape Dental Injury: Teeth and Oropharynx as per pre-operative assessment

## 2019-04-01 NOTE — Anesthesia Preprocedure Evaluation (Addendum)
Anesthesia Evaluation  Patient identified by MRN, date of birth, ID band Patient awake    Reviewed: Allergy & Precautions, NPO status , Patient's Chart, lab work & pertinent test results  History of Anesthesia Complications (+) PONV and history of anesthetic complications  Airway Mallampati: II  TM Distance: >3 FB Neck ROM: Full    Dental no notable dental hx. (+) Dental Advisory Given   Pulmonary asthma ,    Pulmonary exam normal        Cardiovascular hypertension, Pt. on medications negative cardio ROS Normal cardiovascular exam     Neuro/Psych Anxiety negative neurological ROS     GI/Hepatic Neg liver ROS, GERD  ,  Endo/Other  negative endocrine ROS  Renal/GU negative Renal ROS     Musculoskeletal negative musculoskeletal ROS (+)   Abdominal   Peds  Hematology negative hematology ROS (+)   Anesthesia Other Findings Day of surgery medications reviewed with the patient.  Reproductive/Obstetrics                            Anesthesia Physical Anesthesia Plan  ASA: III  Anesthesia Plan: General   Post-op Pain Management:  Regional for Post-op pain   Induction: Intravenous  PONV Risk Score and Plan: 4 or greater and Ondansetron, Dexamethasone, Scopolamine patch - Pre-op and Midazolam  Airway Management Planned: Oral ETT  Additional Equipment:   Intra-op Plan:   Post-operative Plan: Extubation in OR  Informed Consent: I have reviewed the patients History and Physical, chart, labs and discussed the procedure including the risks, benefits and alternatives for the proposed anesthesia with the patient or authorized representative who has indicated his/her understanding and acceptance.     Dental advisory given  Plan Discussed with: CRNA and Anesthesiologist  Anesthesia Plan Comments:        Anesthesia Quick Evaluation

## 2019-04-01 NOTE — Anesthesia Procedure Notes (Signed)
Anesthesia Regional Block: Interscalene brachial plexus block   Pre-Anesthetic Checklist: ,, timeout performed, Correct Patient, Correct Site, Correct Laterality, Correct Procedure, Correct Position, site marked, Risks and benefits discussed,  Surgical consent,  Pre-op evaluation,  At surgeon's request and post-op pain management  Laterality: Left  Prep: chloraprep       Needles:  Injection technique: Single-shot  Needle Type: Echogenic Stimulator Needle     Needle Length: 5cm  Needle Gauge: 22     Additional Needles:   Narrative:  Start time: 04/01/2019 11:41 AM End time: 04/01/2019 11:51 AM Injection made incrementally with aspirations every 5 mL.  Performed by: Personally  Anesthesiologist: Duane Boston, MD  Additional Notes: Functioning IV was confirmed and monitors applied.  A 27mm 22ga echogenic arrow stimulator was used. Sterile prep and drape,hand hygiene and sterile gloves were used.Ultrasound guidance: relevant anatomy identified, needle position confirmed, local anesthetic spread visualized around nerve(s)., vascular puncture avoided.  Image printed for medical record.  Negative aspiration and negative test dose prior to incremental administration of local anesthetic. The patient tolerated the procedure well.

## 2019-04-04 ENCOUNTER — Encounter (HOSPITAL_BASED_OUTPATIENT_CLINIC_OR_DEPARTMENT_OTHER): Payer: Self-pay | Admitting: Orthopedic Surgery

## 2019-04-19 IMAGING — DX DG CHEST 2V
2 series · 2 of 2 positions shown · non-contrast
Comparison: None in PACs

CLINICAL DATA: Throat burning, mid chest pain associated with cough
and shortness of breath after cleaning a bathtub yesterday.

EXAM:
CHEST - 2 VIEW

[chest pa]
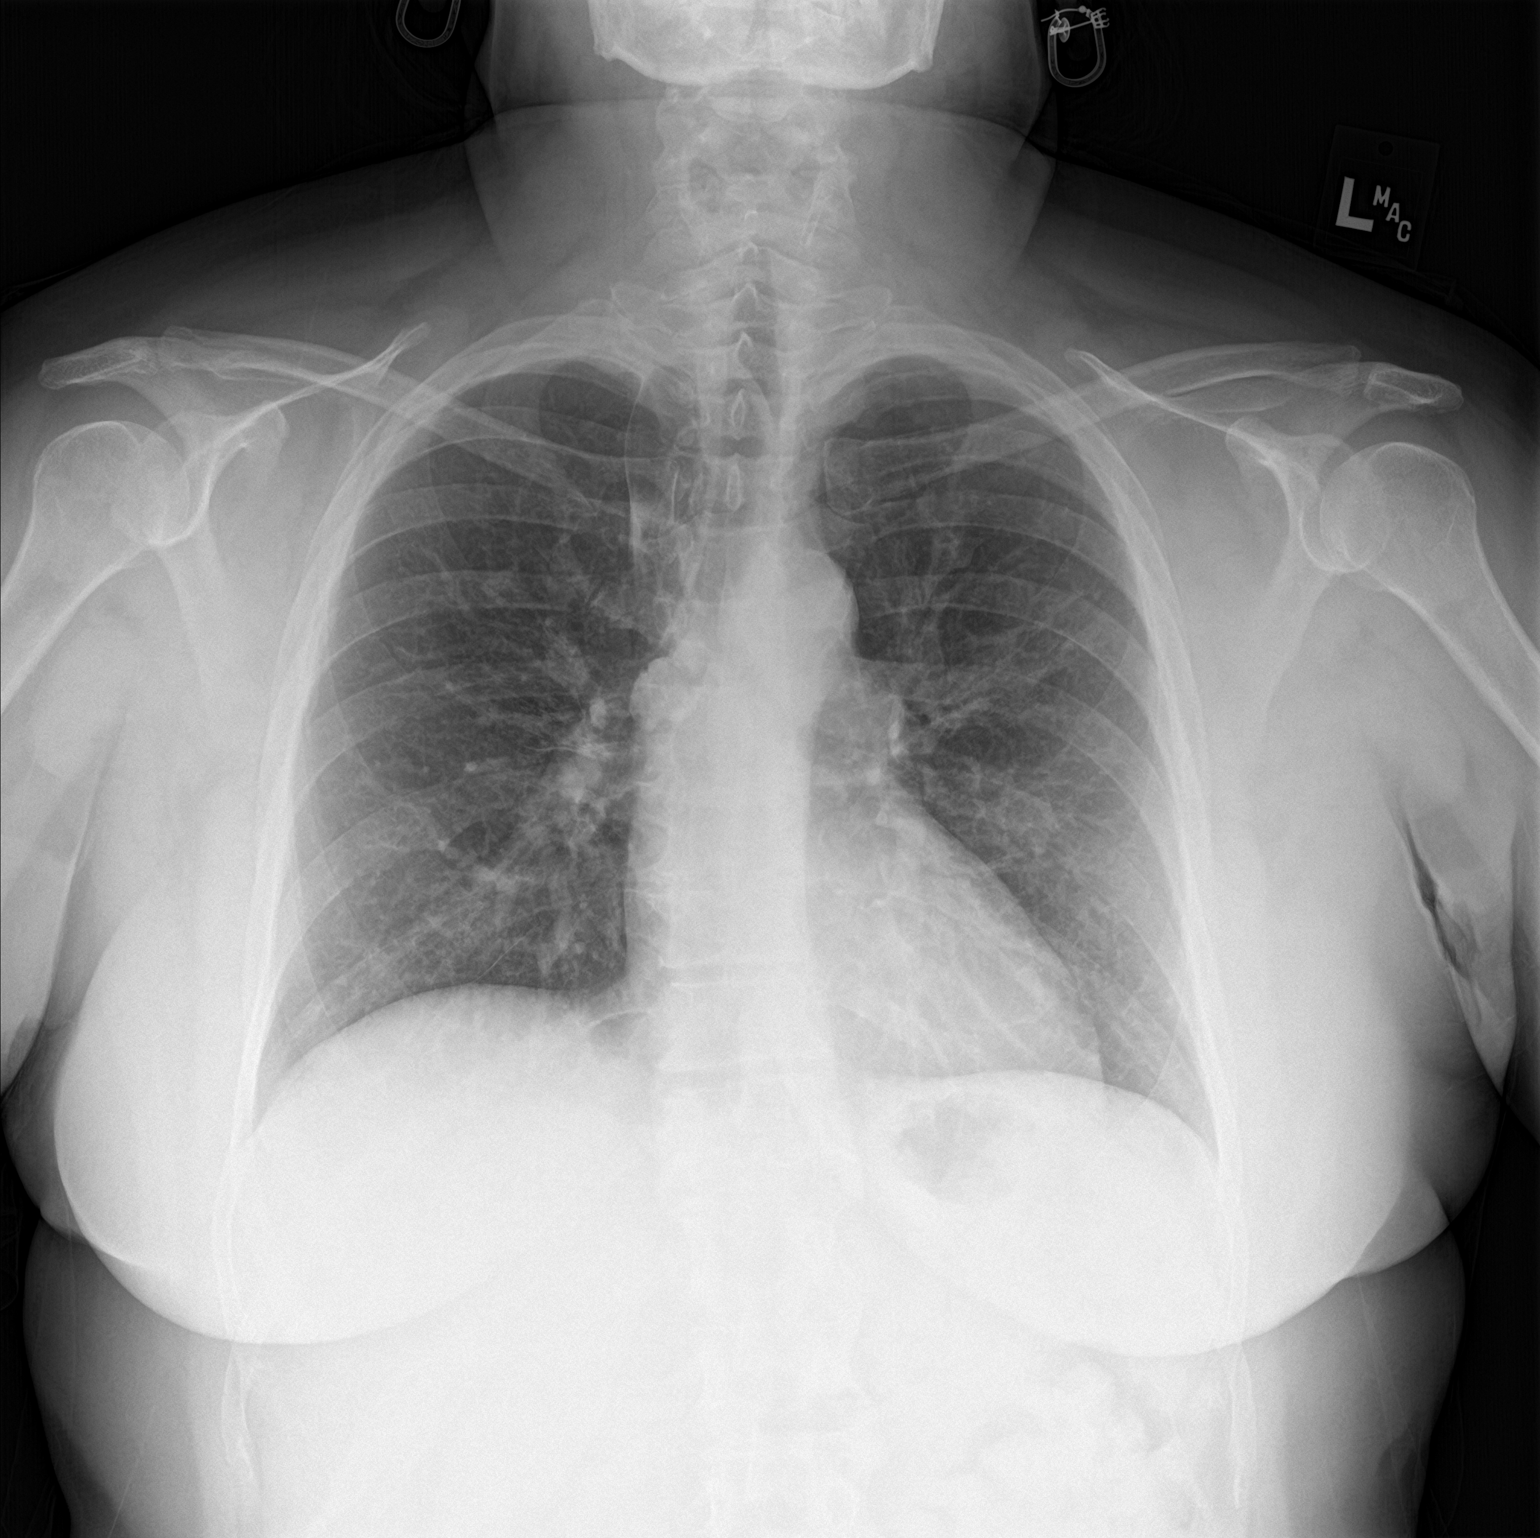

[chest lat]
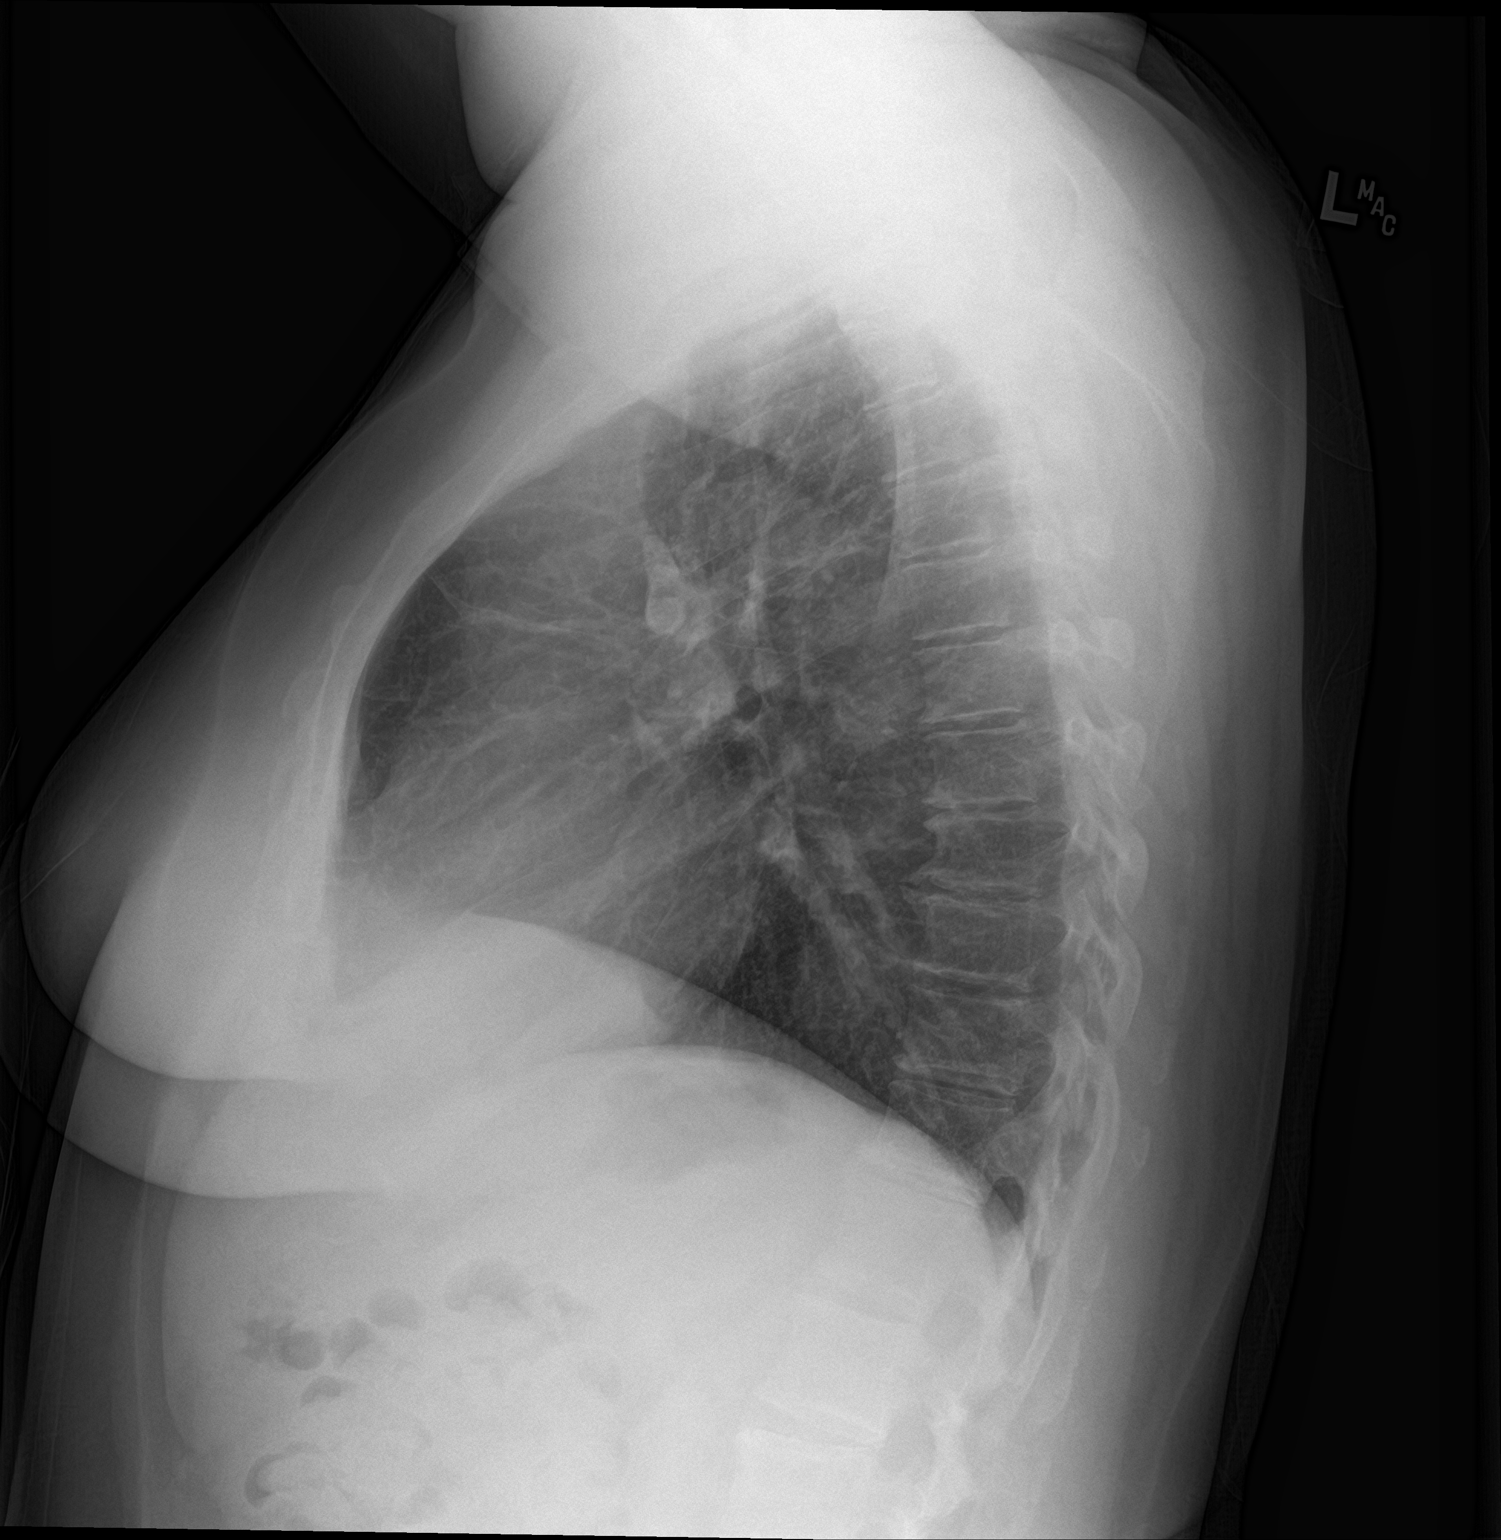

[2 of 2 positions shown; findings below may reference images not displayed]

FINDINGS: The lungs are well-expanded. The interstitial markings are mildly
prominent. There is no alveolar infiltrate or pleural effusion.
There is an azygos lobe anatomy on the right. The heart and
pulmonary vascularity are normal. The mediastinum is normal in
width. The bony thorax exhibits no acute abnormality.
IMPRESSION: No pneumonia nor CHF. Mild interstitial prominence likely reflects
bronchitic change either acute or chronic. No pleural effusion or
pneumothorax.

## 2019-04-21 ENCOUNTER — Ambulatory Visit (INDEPENDENT_AMBULATORY_CARE_PROVIDER_SITE_OTHER): Admitting: Licensed Clinical Social Worker

## 2019-04-21 DIAGNOSIS — F331 Major depressive disorder, recurrent, moderate: Secondary | ICD-10-CM

## 2019-05-03 ENCOUNTER — Ambulatory Visit (INDEPENDENT_AMBULATORY_CARE_PROVIDER_SITE_OTHER): Admitting: Licensed Clinical Social Worker

## 2019-05-03 ENCOUNTER — Ambulatory Visit: Admitting: Licensed Clinical Social Worker

## 2019-05-03 DIAGNOSIS — F331 Major depressive disorder, recurrent, moderate: Secondary | ICD-10-CM | POA: Diagnosis not present

## 2019-05-19 ENCOUNTER — Ambulatory Visit: Admitting: Licensed Clinical Social Worker

## 2019-06-03 ENCOUNTER — Other Ambulatory Visit (HOSPITAL_COMMUNITY): Payer: Self-pay | Admitting: Orthopedic Surgery

## 2019-06-03 ENCOUNTER — Other Ambulatory Visit: Payer: Self-pay | Admitting: Orthopedic Surgery

## 2019-06-03 DIAGNOSIS — T84033A Mechanical loosening of internal left knee prosthetic joint, initial encounter: Secondary | ICD-10-CM

## 2019-06-07 ENCOUNTER — Ambulatory Visit: Admitting: Licensed Clinical Social Worker

## 2019-06-08 ENCOUNTER — Encounter (HOSPITAL_COMMUNITY)
Admission: RE | Admit: 2019-06-08 | Discharge: 2019-06-08 | Disposition: A | Discharge status: Home / Self Care | Source: Ambulatory Visit | Attending: Orthopedic Surgery | Admitting: Orthopedic Surgery

## 2019-06-08 ENCOUNTER — Other Ambulatory Visit: Payer: Self-pay

## 2019-06-08 DIAGNOSIS — T84033A Mechanical loosening of internal left knee prosthetic joint, initial encounter: Secondary | ICD-10-CM | POA: Diagnosis present

## 2019-06-08 MED ORDER — TECHNETIUM TC 99M MEDRONATE IV KIT
20.0000 | PACK | Freq: Once | INTRAVENOUS | Status: AC | PRN
  Administered 2019-06-08: 20 via INTRAVENOUS

## 2019-06-23 ENCOUNTER — Ambulatory Visit (INDEPENDENT_AMBULATORY_CARE_PROVIDER_SITE_OTHER): Admitting: Licensed Clinical Social Worker

## 2019-06-23 DIAGNOSIS — F331 Major depressive disorder, recurrent, moderate: Secondary | ICD-10-CM | POA: Diagnosis not present

## 2019-07-14 ENCOUNTER — Ambulatory Visit (INDEPENDENT_AMBULATORY_CARE_PROVIDER_SITE_OTHER): Admitting: Licensed Clinical Social Worker

## 2019-07-14 DIAGNOSIS — F3341 Major depressive disorder, recurrent, in partial remission: Secondary | ICD-10-CM

## 2019-08-11 ENCOUNTER — Ambulatory Visit: Admitting: Licensed Clinical Social Worker

## 2019-09-20 ENCOUNTER — Other Ambulatory Visit: Payer: Self-pay | Admitting: Family Medicine

## 2019-09-20 DIAGNOSIS — Z1231 Encounter for screening mammogram for malignant neoplasm of breast: Secondary | ICD-10-CM

## 2019-10-24 ENCOUNTER — Encounter: Payer: Self-pay | Admitting: *Deleted

## 2019-10-26 ENCOUNTER — Ambulatory Visit

## 2019-11-14 ENCOUNTER — Ambulatory Visit
Admission: RE | Admit: 2019-11-14 | Discharge: 2019-11-14 | Disposition: A | Discharge status: Home / Self Care | Source: Ambulatory Visit | Attending: Family Medicine | Admitting: Family Medicine

## 2019-11-14 ENCOUNTER — Other Ambulatory Visit: Payer: Self-pay

## 2019-11-14 DIAGNOSIS — Z1231 Encounter for screening mammogram for malignant neoplasm of breast: Secondary | ICD-10-CM

## 2019-11-15 ENCOUNTER — Other Ambulatory Visit: Payer: Self-pay | Admitting: Family Medicine

## 2019-11-15 DIAGNOSIS — N6489 Other specified disorders of breast: Secondary | ICD-10-CM

## 2019-12-15 ENCOUNTER — Other Ambulatory Visit

## 2019-12-15 ENCOUNTER — Encounter

## 2019-12-15 ENCOUNTER — Other Ambulatory Visit: Payer: Self-pay

## 2019-12-15 ENCOUNTER — Ambulatory Visit
Admission: RE | Admit: 2019-12-15 | Discharge: 2019-12-15 | Disposition: A | Discharge status: Home / Self Care | Source: Ambulatory Visit | Attending: Family Medicine | Admitting: Family Medicine

## 2019-12-15 DIAGNOSIS — N6489 Other specified disorders of breast: Secondary | ICD-10-CM

## 2019-12-29 ENCOUNTER — Ambulatory Visit: Admitting: Psychology

## 2020-01-12 ENCOUNTER — Ambulatory Visit: Admitting: Psychology

## 2020-01-18 ENCOUNTER — Ambulatory Visit: Admitting: Psychology

## 2020-01-26 ENCOUNTER — Ambulatory Visit: Admitting: Psychology

## 2020-03-06 ENCOUNTER — Ambulatory Visit (INDEPENDENT_AMBULATORY_CARE_PROVIDER_SITE_OTHER): Admitting: Professional

## 2020-03-06 DIAGNOSIS — F331 Major depressive disorder, recurrent, moderate: Secondary | ICD-10-CM | POA: Diagnosis not present

## 2020-03-06 DIAGNOSIS — F411 Generalized anxiety disorder: Secondary | ICD-10-CM | POA: Diagnosis not present

## 2020-03-12 ENCOUNTER — Ambulatory Visit (INDEPENDENT_AMBULATORY_CARE_PROVIDER_SITE_OTHER): Admitting: Professional

## 2020-03-12 DIAGNOSIS — F4310 Post-traumatic stress disorder, unspecified: Secondary | ICD-10-CM

## 2020-03-12 DIAGNOSIS — F331 Major depressive disorder, recurrent, moderate: Secondary | ICD-10-CM | POA: Diagnosis not present

## 2020-03-12 NOTE — Progress Notes (Signed)
Name: Emma [name]  MRN/ DOB: [id number], 1961/05/12    Age/ Sex: 59 y.o., female    PCP: Leilani Able, MD   Reason for Endocrinology Evaluation: Thyroid disorder     Date of Initial Endocrinology Evaluation: 03/13/2020     HPI: Emma [name] is a 59 y.o. female with a past medical history of HTN, depression and Anxiety . The patient presented for initial endocrinology clinic visit on 03/13/2020 for consultative assistance with her thyroid disorder.   Pt presented to her PCP with c/o hair loss, dry skin and fatigue for the past 7-8 months ago as well as weight gain.   No prior exposure to radiation  She was on Biotin but stopped 2 months ago , she does not recall if she was on it at the time of prior thyroid testing   No local neck swelling or pain   No FH of thyroid disease  Pt is c/o abdominal pain that started 4 days ago, associated with nausea and vomiting, she is going to see her PCP today. She attributes this to an ulcer that she had previously.   HISTORY:  Past Medical History:  Past Medical History:  Diagnosis Date  . Anxiety   . Asthma   . GERD (gastroesophageal reflux disease)   . Hypertension   . PONV (postoperative nausea and vomiting)   . Sleep apnea    non compliant with CPAP   Past Surgical History:  Past Surgical History:  Procedure Laterality Date  . ABDOMINAL HYSTERECTOMY    . CHOLECYSTECTOMY    . JOINT REPLACEMENT    . SHOULDER ARTHROSCOPY WITH SUBACROMIAL DECOMPRESSION, ROTATOR CUFF REPAIR AND BICEP TENDON REPAIR Left 04/01/2019   Procedure: LEFT SHOULDER ARTHROSCOPY WITH SUBACROMIAL DECOMPRESSION, DISTAL CLAVICLE EXCISION, BICEP TENODESIS, ROTATOR CUFF REPAIR, spinal needle decrompression of glenoid cyst;  Surgeon: Sheral Apley, MD;  Location: [facility];  Service: Orthopedics;  Laterality: Left;    Social History:  reports that she has never smoked. She has never used smokeless tobacco. She reports that she does not drink alcohol  and does not use drugs. Family History: family history includes Breast cancer (age of onset: 72) in her mother; Breast cancer (age of onset: 12) in her sister.   HOME MEDICATIONS: Allergies as of 03/13/2020      Reactions   Codeine Nausea Only      Medication List       Accurate as of March 13, 2020  8:14 AM. If you have any questions, ask your nurse or doctor.        cetirizine 10 MG tablet Commonly known as: ZYRTEC Take 10 mg by mouth daily.   clonazePAM 0.5 MG tablet Commonly known as: KLONOPIN Take 0.5 mg by mouth 2 (two) times daily.   DULoxetine 20 MG capsule Commonly known as: CYMBALTA Take 40 mg by mouth daily.   estradiol 2 MG tablet Commonly known as: ESTRACE Take 2 mg by mouth daily.   Eszopiclone 3 MG Tabs Take 3 mg by mouth at bedtime as needed for sleep. Take immediately before bedtime   Fish Oil 1000 MG Caps Take by mouth.   gabapentin 300 MG capsule Commonly known as: Neurontin Take 1 capsule (300 mg total) by mouth 2 (two) times daily for 14 days. For 2 weeks post op for pain.   losartan-hydrochlorothiazide 50-12.5 MG tablet Commonly known as: HYZAAR Take 1 tablet by mouth daily.   meloxicam 15 MG tablet Commonly known as: MOBIC Take  15 mg by mouth daily.   methocarbamol 500 MG tablet Commonly known as: Robaxin Take 1 tablet (500 mg total) by mouth every 8 (eight) hours as needed for muscle spasms.   omeprazole 40 MG capsule Commonly known as: PRILOSEC Take 40 mg by mouth daily.   ondansetron 4 MG tablet Commonly known as: Zofran Take 1 tablet (4 mg total) by mouth every 8 (eight) hours as needed for nausea or vomiting.   PROBIOTIC-10 PO Take by mouth.   Vitamin D (Ergocalciferol) 1.25 MG (50000 UNIT) Caps capsule Commonly known as: DRISDOL Take 50,000 Units by mouth every 7 (seven) days.         REVIEW OF SYSTEMS: A comprehensive ROS was conducted with the patient and is negative except as per HPI  OBJECTIVE:  VS: BP (!)  146/98 (BP Location: Right Arm, Patient Position: Sitting, Cuff Size: Normal)   Pulse (!) 105   Ht 5' 2.44" (1.586 m)   Wt 176 lb 3.2 oz (79.9 kg)   SpO2 98%   BMI 31.77 kg/m    Wt Readings from Last 3 Encounters:  03/13/20 176 lb 3.2 oz (79.9 kg)  04/01/19 177 lb 11.1 oz (80.6 kg)  12/29/17 175 lb (79.4 kg)     EXAM: General: Pt appears well and is in NAD  Neck: General: Supple without adenopathy. Thyroid: Thyroid size normal.  No goiter or nodules appreciated. No thyroid bruit.  Lungs: Clear with good BS bilat with no rales, rhonchi, or wheezes  Heart: Auscultation: RRR.  Abdomen: Normoactive bowel sounds, soft, with periumbilical tenderness  Extremities:  BL LE: No pretibial edema normal ROM and strength.  Skin: Hair: Texture and amount normal with gender appropriate distribution Skin Inspection: No rashes Skin Palpation: Skin temperature, texture, and thickness normal to palpation  Neuro: Cranial nerves: II - XII grossly intact  Motor: Normal strength throughout DTRs: 2+ and symmetric in UE without delay in relaxation phase  Mental Status: Judgment, insight: Intact Orientation: Oriented to time, place, and person Mood and affect: No depression, anxiety, or agitation     DATA REVIEWED: 12/22/2019 TSH 3.22 uiu/mL Total t4 12.7 ( 5.14-11.9)   Results for SHALUNDA, LINDH (MRN [id number]) as of 03/13/2020 12:20  Ref. Range 03/13/2020 08:27  TSH Latest Ref Range: 0.35 - 4.50 uIU/mL 7.49 (H)  T4,Free(Direct) Latest Ref Range: 0.60 - 1.60 ng/dL 0.26     ASSESSMENT/PLAN/RECOMMENDATIONS:   Hypothyroidism  - Pt is clinically and biochemically hypothyroid  - Will proceed with LT- 4 replacement as below   Medications : Start Levothyroxine 50 mcg daily   2. Abdominal pain :  - Pt attributes this to an ulcer, but I am concerned about appendicitis. She has an appointment with her PCP in 2 hours. Pt advised to report to ED should her symptoms worsen.     Addendum:  Attempted to call the pt on 03/13/2020 at 1230 but there was no answer, a message left to check the portal and was advised to schedule a follow up in 3-4 months     Signed electronically by: Lyndle Herrlich, MD  [facility] Endocrinology  [facility] [address] [place], [place] [address] Phone: [phone] FAX: [phone]   CC: Leilani Able, MD [address] [place] [address] Phone: [phone] Fax: [phone]   Return to Endocrinology clinic as below: Future Appointments  Date Time Provider Department Center  04/11/2020  8:00 AM Teofilo Pod, [facility] None

## 2020-03-13 ENCOUNTER — Other Ambulatory Visit: Payer: Self-pay

## 2020-03-13 ENCOUNTER — Encounter: Payer: Self-pay | Admitting: Internal Medicine

## 2020-03-13 ENCOUNTER — Ambulatory Visit (INDEPENDENT_AMBULATORY_CARE_PROVIDER_SITE_OTHER): Admitting: Internal Medicine

## 2020-03-13 VITALS — BP 146/98 | HR 105 | Ht 62.44 in | Wt 176.2 lb

## 2020-03-13 DIAGNOSIS — R1033 Periumbilical pain: Secondary | ICD-10-CM | POA: Insufficient documentation

## 2020-03-13 DIAGNOSIS — E039 Hypothyroidism, unspecified: Secondary | ICD-10-CM | POA: Diagnosis not present

## 2020-03-13 DIAGNOSIS — R7989 Other specified abnormal findings of blood chemistry: Secondary | ICD-10-CM | POA: Diagnosis not present

## 2020-03-13 LAB — TSH: TSH: 7.49 u[IU]/mL — ABNORMAL HIGH (ref 0.35–4.50)

## 2020-03-13 LAB — T4, FREE: Free T4: 0.78 ng/dL (ref 0.60–1.60)

## 2020-03-13 MED ORDER — LEVOTHYROXINE SODIUM 50 MCG PO TABS: 50.0000 ug | ORAL_TABLET | Freq: Every day | ORAL | 1 refills | Status: DC

## 2020-03-13 NOTE — Patient Instructions (Signed)
-   Please stop by the lab today, will contact you through "my chart" about these results

## 2020-03-21 ENCOUNTER — Ambulatory Visit (INDEPENDENT_AMBULATORY_CARE_PROVIDER_SITE_OTHER): Admitting: Professional

## 2020-03-21 DIAGNOSIS — F4310 Post-traumatic stress disorder, unspecified: Secondary | ICD-10-CM

## 2020-03-21 DIAGNOSIS — F331 Major depressive disorder, recurrent, moderate: Secondary | ICD-10-CM

## 2020-03-28 ENCOUNTER — Ambulatory Visit (INDEPENDENT_AMBULATORY_CARE_PROVIDER_SITE_OTHER): Admitting: Professional

## 2020-03-28 DIAGNOSIS — F331 Major depressive disorder, recurrent, moderate: Secondary | ICD-10-CM | POA: Diagnosis not present

## 2020-03-28 DIAGNOSIS — F4310 Post-traumatic stress disorder, unspecified: Secondary | ICD-10-CM | POA: Diagnosis not present

## 2020-04-11 ENCOUNTER — Ambulatory Visit: Admitting: Professional

## 2020-05-07 ENCOUNTER — Ambulatory Visit (INDEPENDENT_AMBULATORY_CARE_PROVIDER_SITE_OTHER): Admitting: Professional

## 2020-05-07 DIAGNOSIS — F4310 Post-traumatic stress disorder, unspecified: Secondary | ICD-10-CM

## 2020-05-07 DIAGNOSIS — F331 Major depressive disorder, recurrent, moderate: Secondary | ICD-10-CM | POA: Diagnosis not present

## 2020-05-17 ENCOUNTER — Telehealth: Payer: Self-pay

## 2020-05-17 NOTE — Telephone Encounter (Signed)
Spoke with patient and advise her the she is suppose to be taking the Levothyroxine 50 mcg daily .

## 2020-05-17 NOTE — Telephone Encounter (Signed)
New message    The patient has two medication needs clarification on which one she should take.   Asking for a call back from the nurse.

## 2020-05-19 ENCOUNTER — Emergency Department (HOSPITAL_BASED_OUTPATIENT_CLINIC_OR_DEPARTMENT_OTHER)

## 2020-05-19 ENCOUNTER — Encounter (HOSPITAL_BASED_OUTPATIENT_CLINIC_OR_DEPARTMENT_OTHER): Payer: Self-pay | Admitting: Emergency Medicine

## 2020-05-19 ENCOUNTER — Other Ambulatory Visit: Payer: Self-pay

## 2020-05-19 ENCOUNTER — Emergency Department (HOSPITAL_BASED_OUTPATIENT_CLINIC_OR_DEPARTMENT_OTHER)
Admission: EM | Admit: 2020-05-19 | Discharge: 2020-05-19 | Disposition: A | Discharge status: Home / Self Care | Attending: Emergency Medicine | Admitting: Emergency Medicine

## 2020-05-19 DIAGNOSIS — E039 Hypothyroidism, unspecified: Secondary | ICD-10-CM | POA: Insufficient documentation

## 2020-05-19 DIAGNOSIS — U071 COVID-19: Secondary | ICD-10-CM | POA: Diagnosis not present

## 2020-05-19 DIAGNOSIS — Z79890 Hormone replacement therapy (postmenopausal): Secondary | ICD-10-CM | POA: Diagnosis not present

## 2020-05-19 DIAGNOSIS — R0602 Shortness of breath: Secondary | ICD-10-CM | POA: Diagnosis present

## 2020-05-19 DIAGNOSIS — J45909 Unspecified asthma, uncomplicated: Secondary | ICD-10-CM | POA: Diagnosis not present

## 2020-05-19 DIAGNOSIS — Z79899 Other long term (current) drug therapy: Secondary | ICD-10-CM | POA: Insufficient documentation

## 2020-05-19 DIAGNOSIS — I10 Essential (primary) hypertension: Secondary | ICD-10-CM | POA: Insufficient documentation

## 2020-05-19 LAB — COMPREHENSIVE METABOLIC PANEL
ALT: 18 U/L (ref 0–44)
AST: 20 U/L (ref 15–41)
Albumin: 3.6 g/dL (ref 3.5–5.0)
Alkaline Phosphatase: 70 U/L (ref 38–126)
Anion gap: 11 (ref 5–15)
BUN: 13 mg/dL (ref 6–20)
CO2: 23 mmol/L (ref 22–32)
Calcium: 8.7 mg/dL — ABNORMAL LOW (ref 8.9–10.3)
Chloride: 101 mmol/L (ref 98–111)
Creatinine, Ser: 0.65 mg/dL (ref 0.44–1.00)
GFR calc Af Amer: >60 mL/min (ref >60)
GFR calc non Af Amer: >60 mL/min (ref >60)
Glucose, Bld: 96 mg/dL (ref 70–99)
Potassium: 4.1 mmol/L (ref 3.5–5.1)
Sodium: 135 mmol/L (ref 135–145)
Total Bilirubin: 0.4 mg/dL (ref 0.3–1.2)
Total Protein: 7.2 g/dL (ref 6.5–8.1)

## 2020-05-19 LAB — CBC WITH DIFFERENTIAL/PLATELET
Abs Immature Granulocytes: 0.02 10*3/uL (ref 0.00–0.07)
Basophils Absolute: 0.1 10*3/uL (ref 0.0–0.1)
Basophils Relative: 1 %
Eosinophils Absolute: 0.2 10*3/uL (ref 0.0–0.5)
Eosinophils Relative: 2 %
HCT: 39.5 % (ref 36.0–46.0)
Hemoglobin: 13.5 g/dL (ref 12.0–15.0)
Immature Granulocytes: 0 %
Lymphocytes Relative: 35 %
Lymphs Abs: 2.8 10*3/uL (ref 0.7–4.0)
MCH: 28.4 pg (ref 26.0–34.0)
MCHC: 34.2 g/dL (ref 30.0–36.0)
MCV: 83 fL (ref 80.0–100.0)
Monocytes Absolute: 0.5 10*3/uL (ref 0.1–1.0)
Monocytes Relative: 6 %
Neutro Abs: 4.6 10*3/uL (ref 1.7–7.7)
Neutrophils Relative %: 56 %
Platelets: 291 10*3/uL (ref 150–400)
RBC: 4.76 MIL/uL (ref 3.87–5.11)
RDW: 12.7 % (ref 11.5–15.5)
WBC: 8.1 10*3/uL (ref 4.0–10.5)
nRBC: 0 % (ref 0.0–0.2)

## 2020-05-19 MED ORDER — IOHEXOL 350 MG/ML SOLN
100.0000 mL | Freq: Once | INTRAVENOUS | Status: AC | PRN
  Administered 2020-05-19: 100 mL via INTRAVENOUS

## 2020-05-19 NOTE — ED Triage Notes (Signed)
Pt here with COVID and SOB and chest pain x 3 days.

## 2020-05-19 NOTE — ED Notes (Signed)
Pt ambulated in room maintaining 98-100% O2 saturation

## 2020-05-19 NOTE — ED Notes (Signed)
ED Provider at bedside. 

## 2020-05-19 NOTE — ED Notes (Signed)
Pt reports positive covid test this week, onset of symptoms last Friday, (day 8)

## 2020-05-19 NOTE — Discharge Instructions (Signed)
Please follow-up with primary care provider regarding today's encounter.  Your comprehensive work-up obtained here today was entirely reassuring.  Please call the [facility] infusion center at [phone] to see if you qualify for monoclonal antibody infusions given your shortness of breath in the context of COVID-19.  Please return to the ED or seek immediate medical attention should you experience any new or worsening symptoms.

## 2020-05-19 NOTE — ED Notes (Signed)
The following note was included in imaging notes:   **extravasation of IV contrast occurred during last 5 ML  and beginning of 20 ML Saline flush post contrast; instructions given to pt concerning possible complications of IV contrast extravasation; EDRN made aware; IV Dc'd and warm pack applied; pt was still in CT area 10 post injection and notable decrease of extravasation site was noted; pt was not complaining of any pain in the area of concern.  Additional imaging was performed through area of extravasation for evaluation purposes.

## 2020-05-19 NOTE — ED Notes (Signed)
Ambulated in room, O2 97% in Room air.

## 2020-05-19 NOTE — ED Provider Notes (Signed)
[facility] EMERGENCY DEPARTMENT Provider Note   CSN: [id number] Arrival date & time: 05/19/20  1257     History Chief Complaint  Patient presents with  . Cough  . Shortness of Breath    Emma [name] is a 59 y.o. female with PMH significant for asthma with positive COVID-19 testing, symptomatic since 05/11/2020, who presents to the ED with complaints of chest pain shortness of breath.  Reports that she is fully immunized for COVID-19.  However, she became symptomatic with headache, cough, malaise, and fevers last week.  She went to an urgent care and tested positive for COVID-19.  She was feeling relatively improved, however yesterday woke up with shortness of breath and chest pain that was new when compared to the day prior.  She is concerned for pneumonia.  She states that her chest pain is worse when she tries to take a deep breath and states that she has trouble catching her breath.  She denies any productive cough or hemoptysis.  She also denies any history of clots, unilateral extremity swelling or edema, immobilization or recent surgeries, exertional chest pain, abdominal pain, nausea or vomiting, or other symptoms.  She tells me that she does take oral estrogen.  HPI     Past Medical History:  Diagnosis Date  . Anxiety   . Asthma   . GERD (gastroesophageal reflux disease)   . Hypertension   . PONV (postoperative nausea and vomiting)   . Sleep apnea    non compliant with CPAP    Patient Active Problem List   Diagnosis Date Noted  . Acquired hypothyroidism 03/13/2020  . Periumbilical abdominal pain 03/13/2020    Past Surgical History:  Procedure Laterality Date  . ABDOMINAL HYSTERECTOMY    . CHOLECYSTECTOMY    . JOINT REPLACEMENT    . SHOULDER ARTHROSCOPY WITH SUBACROMIAL DECOMPRESSION, ROTATOR CUFF REPAIR AND BICEP TENDON REPAIR Left 04/01/2019   Procedure: LEFT SHOULDER ARTHROSCOPY WITH SUBACROMIAL DECOMPRESSION, DISTAL CLAVICLE EXCISION, BICEP TENODESIS,  ROTATOR CUFF REPAIR, spinal needle decrompression of glenoid cyst;  Surgeon: Sheral Apley, MD;  Location: [facility];  Service: Orthopedics;  Laterality: Left;     OB History   No obstetric history on file.     Family History  Problem Relation Age of Onset  . Breast cancer Mother 35  . Breast cancer Sister 70    Social History   Tobacco Use  . Smoking status: Never Smoker  . Smokeless tobacco: Never Used  Vaping Use  . Vaping Use: Never used  Substance Use Topics  . Alcohol use: Never  . Drug use: Never    Home Medications Prior to Admission medications   Medication Sig Start Date End Date Taking? Authorizing Provider  cetirizine (ZYRTEC) 10 MG tablet Take 10 mg by mouth daily.    [provider]  clonazePAM (KLONOPIN) 0.5 MG tablet Take 0.5 mg by mouth 2 (two) times daily. 03/07/20   [provider]  DULoxetine (CYMBALTA) 20 MG capsule Take 40 mg by mouth daily.     [provider]  estradiol (ESTRACE) 2 MG tablet Take 2 mg by mouth daily.     [provider]  Eszopiclone 3 MG TABS Take 3 mg by mouth at bedtime as needed for sleep. Take immediately before bedtime     [provider]  gabapentin (NEURONTIN) 300 MG capsule Take 1 capsule (300 mg total) by mouth 2 (two) times daily for 14 days. For 2 weeks post op for  pain. 04/01/19 04/15/19  Albina Billet III, PA-C  levothyroxine (SYNTHROID) 50 MCG tablet Take 1 tablet (50 mcg total) by mouth daily. 03/13/20   Shamleffer, Konrad Dolores, MD  losartan-hydrochlorothiazide (HYZAAR) 50-12.5 MG tablet Take 1 tablet by mouth daily. Patient not taking: Reported on 03/13/2020    [provider]  meloxicam (MOBIC) 15 MG tablet Take 15 mg by mouth daily.    [provider]  methocarbamol (ROBAXIN) 500 MG tablet Take 1 tablet (500 mg total) by mouth every 8 (eight) hours as needed for muscle spasms. 04/01/19   Albina Billet III, PA-C  Omega-3  Fatty Acids (FISH OIL) 1000 MG CAPS Take by mouth.    [provider]  omeprazole (PRILOSEC) 40 MG capsule Take 40 mg by mouth daily.     [provider]  ondansetron (ZOFRAN) 4 MG tablet Take 1 tablet (4 mg total) by mouth every 8 (eight) hours as needed for nausea or vomiting. Patient not taking: Reported on 03/13/2020 04/01/19   Albina Billet III, PA-C  Probiotic Product (PROBIOTIC-10 PO) Take by mouth.    [provider]  Vitamin D, Ergocalciferol, (DRISDOL) 50000 units CAPS capsule Take 50,000 Units by mouth every 7 (seven) days.    [provider]    Allergies    Codeine  Review of Systems   Review of Systems  All other systems reviewed and are negative.   Physical Exam Updated Vital Signs BP (!) 153/89   Pulse 84   Temp 97.7 F (36.5 C)   Resp 10   Ht 5\' 2"  (1.575 m)   Wt 82.4 kg   SpO2 98%   BMI 33.22 kg/m   Physical Exam Vitals and nursing note reviewed. Exam conducted with a chaperone present.  Constitutional:      Appearance: She is ill-appearing.  HENT:     Head: Normocephalic and atraumatic.  Eyes:     General: No scleral icterus.    Conjunctiva/sclera: Conjunctivae normal.  Cardiovascular:     Rate and Rhythm: Regular rhythm. Tachycardia present.     Pulses: Normal pulses.     Heart sounds: Normal heart sounds.  Pulmonary:     Comments: Mildly tachypneic, but no significant increased work of breathing.  Chest rises symmetric.  Breath sounds intact bilaterally.  No distress. Musculoskeletal:     Right lower leg: No edema.     Left lower leg: No edema.     Comments: No unilateral extremity swelling.  Skin:    General: Skin is dry.     Capillary Refill: Capillary refill takes less than 2 seconds.  Neurological:     Mental Status: She is alert and oriented to person, place, and time.     GCS: GCS eye subscore is 4. GCS verbal subscore is 5. GCS motor subscore is 6.  Psychiatric:        Mood and Affect: Mood  normal.        Behavior: Behavior normal.        Thought Content: Thought content normal.     ED Results / Procedures / Treatments   Labs (all labs ordered are listed, but only abnormal results are displayed) Labs Reviewed  COMPREHENSIVE METABOLIC PANEL - Abnormal; Notable for the following components:      Result Value   Calcium 8.7 (*)    All other components within normal limits  CBC WITH DIFFERENTIAL/PLATELET    EKG None  Radiology CT Angio Chest PE W/Cm &/Or Wo Cm  Result Date: 05/19/2020 CLINICAL DATA:  Shortness of breath and chest pain, history of COVID-19 positivity EXAM: CT ANGIOGRAPHY CHEST WITH CONTRAST TECHNIQUE: Multidetector CT imaging of the chest was performed using the standard protocol during bolus administration of intravenous contrast. Multiplanar CT image reconstructions and MIPs were obtained to evaluate the vascular anatomy. CONTRAST:  OMNIPAQUE IOHEXOL 350 MG/ML SOLN. Mild extravasation of the postcontrast saline flush was noted. This was well tolerated by the patient. COMPARISON:  None FINDINGS: Cardiovascular: Thoracic aorta demonstrates a normal branching pattern. Minimal atherosclerotic calcifications are seen. No cardiac enlargement is noted. The pulmonary artery shows a normal branching pattern. No intraluminal filling defect to suggest pulmonary embolism is noted. Mediastinum/Nodes: Thoracic inlet is within normal limits. No sizable hilar or mediastinal adenopathy is noted. The esophagus is unremarkable. Lungs/Pleura: Azygos lobe is noted. Lungs are well aerated bilaterally. No focal infiltrate or sizable effusion is noted. No sizable parenchymal nodule is seen. Upper Abdomen: Fatty infiltration of the liver is noted. Musculoskeletal: Degenerative changes of the thoracic spine are noted. Review of the MIP images confirms the above findings. IMPRESSION: No evidence of pulmonary emboli. Electronically Signed   By: Alcide Clever M.D.   On: 05/19/2020 17:43     Procedures Procedures (including critical care time)  Medications Ordered in ED Medications  iohexol (OMNIPAQUE) 350 MG/ML injection 100 mL (100 mLs Intravenous Contrast Given 05/19/20 1640)    ED Course  I have reviewed the triage vital signs and the nursing notes.  Pertinent labs & imaging results that were available during my care of the patient were reviewed by me and considered in my medical decision making (see chart for details).    MDM Rules/Calculators/A&P                          Given patient's new onset chest pain and shortness of breath, tachycardia, tachypnea, pleuritic chest pain that radiates towards her back, oral estrogen, and positive COVID-19 testing, will obtain CTA PE study to evaluate for possible pulmonary embolism.  Will obtain basic laboratory work-up.    Labs CBC with differential: Entirely within normal limits. CMP: No electrolyte derangement.  No impaired renal function or liver impairment.  Imaging I personally reviewed CTA PE study and there is no evidence of pulmonary emboli.  Additionally, there is no particular concerning evidence of infiltrates suggestive of COVID-19 pneumonia.  EKG demonstrates normal sinus rhythm.  I discussed these findings with the patient and she is reassured by today's comprehensive work-up.  She tells me that she was having some vomiting a few days ago which she believes precipitated her anterior chest wall pain that is reproducible with palpation on physical exam.  Reasonable for discharge home with continued conservative therapy.  Will provide patient with the number for [facility] infusion center.  She is on day eight and will need to receive the [facility] infusion in the next couple of days.    Marwah Disbro was evaluated in Emergency Department on 05/19/2020 for the symptoms described in the history of present illness. She was evaluated in the context of the global COVID-19 pandemic, which necessitated consideration that the patient  might be at risk for infection with the SARS-CoV-2 virus that causes COVID-19. Institutional protocols and algorithms that pertain to the evaluation of patients at risk for COVID-19 are in a state of rapid change based on information released by regulatory bodies including the CDC and federal and state organizations. These policies and algorithms were  followed during the patient's care in the ED.   Final Clinical Impression(s) / ED Diagnoses Final diagnoses:  Shortness of breath  COVID-19    Rx / DC Orders ED Discharge Orders    None       Lorelee NewGreen, Tinia Oravec L, PA-C 05/19/20 1833    Alvira MondaySchlossman, Erin, MD 05/21/20 [phone]

## 2020-05-21 ENCOUNTER — Ambulatory Visit: Admitting: Professional

## 2020-05-28 ENCOUNTER — Ambulatory Visit (INDEPENDENT_AMBULATORY_CARE_PROVIDER_SITE_OTHER): Admitting: Professional

## 2020-05-28 DIAGNOSIS — F331 Major depressive disorder, recurrent, moderate: Secondary | ICD-10-CM | POA: Diagnosis not present

## 2020-05-28 DIAGNOSIS — F4310 Post-traumatic stress disorder, unspecified: Secondary | ICD-10-CM

## 2020-06-04 ENCOUNTER — Ambulatory Visit: Admitting: Professional

## 2020-06-11 ENCOUNTER — Ambulatory Visit (INDEPENDENT_AMBULATORY_CARE_PROVIDER_SITE_OTHER): Admitting: Professional

## 2020-06-11 DIAGNOSIS — F4310 Post-traumatic stress disorder, unspecified: Secondary | ICD-10-CM

## 2020-06-11 DIAGNOSIS — F331 Major depressive disorder, recurrent, moderate: Secondary | ICD-10-CM

## 2020-06-18 ENCOUNTER — Ambulatory Visit: Admitting: Professional

## 2020-06-19 ENCOUNTER — Other Ambulatory Visit: Payer: Self-pay

## 2020-06-19 ENCOUNTER — Encounter: Payer: Self-pay | Admitting: Internal Medicine

## 2020-06-19 ENCOUNTER — Ambulatory Visit: Admitting: Internal Medicine

## 2020-06-19 VITALS — BP 146/80 | HR 90 | Ht 62.0 in | Wt 175.0 lb

## 2020-06-19 DIAGNOSIS — E039 Hypothyroidism, unspecified: Secondary | ICD-10-CM | POA: Diagnosis not present

## 2020-06-19 NOTE — Patient Instructions (Signed)
labs today

## 2020-06-19 NOTE — Progress Notes (Signed)
Name: Keirstin Musil  MRN/ DOB: [id number], 1960-10-08    Age/ Sex: 59 y.o., female     PCP: Leilani Able, MD   Reason for Endocrinology Evaluation: Hypothyroidism     Initial Endocrinology Clinic Visit: 03/13/2020    PATIENT IDENTIFIER: Ms. Jasara Corrigan is a 59 y.o., female with a past medical history of HTN, depression and anxiety. She has followed with [facility] since 03/13/2020 for consultative assistance with management of her Hypothyroidism.   HISTORICAL SUMMARY:  Pt presented to her PCP with c/o hair loss, dry skin and fatigue in 11/2019 . TSH was normal at the time ( she was on Biotin). Repeat TSH at our office in 02/2020 was noted to have a TSH of 7.49 uIU/mL and was started on LT-4 replacement at the time.    SUBJECTIVE:    Today (06/19/2020):  Ms. Collyer is here for a follow up on hypothyroidism .   She has been noted with weight loss   Sleeps all day , sees a therapist for depression  Continues with fatigue  Takes stool softeners for constipation   No local neck symptoms  Continues with abdominal pain     She is on Levothyroxine 50 mcg daily      HISTORY:  Past Medical History:  Past Medical History:  Diagnosis Date  . Anxiety   . Asthma   . GERD (gastroesophageal reflux disease)   . Hypertension   . PONV (postoperative nausea and vomiting)   . Sleep apnea    non compliant with CPAP   Past Surgical History:  Past Surgical History:  Procedure Laterality Date  . ABDOMINAL HYSTERECTOMY    . CHOLECYSTECTOMY    . JOINT REPLACEMENT    . SHOULDER ARTHROSCOPY WITH SUBACROMIAL DECOMPRESSION, ROTATOR CUFF REPAIR AND BICEP TENDON REPAIR Left 04/01/2019   Procedure: LEFT SHOULDER ARTHROSCOPY WITH SUBACROMIAL DECOMPRESSION, DISTAL CLAVICLE EXCISION, BICEP TENODESIS, ROTATOR CUFF REPAIR, spinal needle decrompression of glenoid cyst;  Surgeon: Sheral Apley, MD;  Location: [facility];  Service: Orthopedics;  Laterality: Left;    Social History:  reports that she has never smoked. She has never used smokeless tobacco. She reports that she does not drink alcohol and does not use drugs. Family History:  Family History  Problem Relation Age of Onset  . Breast cancer Mother 40  . Breast cancer Sister 74     HOME MEDICATIONS: Allergies as of 06/19/2020      Reactions   Codeine Nausea Only      Medication List       Accurate as of June 19, 2020 10:36 AM. If you have any questions, ask your nurse or doctor.        STOP taking these medications   clonazePAM 0.5 MG tablet Commonly known as: KLONOPIN Stopped by: Scarlette Shorts, MD   gabapentin 300 MG capsule Commonly known as: Neurontin Stopped by: Scarlette Shorts, MD   methocarbamol 500 MG tablet Commonly known as: Robaxin Stopped by: Scarlette Shorts, MD     TAKE these medications   cetirizine 10 MG tablet Commonly known as: ZYRTEC Take 10 mg by mouth daily.   DULoxetine 20 MG capsule Commonly known as: CYMBALTA Take 40 mg by mouth daily.   estradiol 2 MG tablet Commonly known as: ESTRACE Take 2 mg by mouth daily.   Eszopiclone 3 MG Tabs Take 3 mg by mouth at bedtime as needed for sleep. Take immediately before bedtime   Fish Oil 1000 MG  Caps Take by mouth.   levothyroxine 50 MCG tablet Commonly known as: SYNTHROID Take 1 tablet (50 mcg total) by mouth daily.   losartan-hydrochlorothiazide 50-12.5 MG tablet Commonly known as: HYZAAR Take 1 tablet by mouth daily.   meloxicam 15 MG tablet Commonly known as: MOBIC Take 15 mg by mouth daily.   omeprazole 40 MG capsule Commonly known as: PRILOSEC Take 40 mg by mouth daily.   ondansetron 4 MG tablet Commonly known as: Zofran Take 1 tablet (4 mg total) by mouth every 8 (eight) hours as needed for nausea or vomiting.   PROBIOTIC-10 PO Take by mouth.   Vitamin D (Ergocalciferol) 1.25 MG (50000 UNIT) Caps capsule Commonly known as: DRISDOL Take 50,000 Units  by mouth every 7 (seven) days.         OBJECTIVE:   PHYSICAL EXAM: VS: BP (!) 146/80   Pulse 90   Ht 5\' 2"  (1.575 m)   Wt 175 lb (79.4 kg)   SpO2 98%   BMI 32.01 kg/m    EXAM: General: Pt appears well and is in NAD  Neck: General: Supple without adenopathy. Thyroid: Thyroid size normal.  No goiter or nodules appreciated. No thyroid bruit.  Lungs: Clear with good BS bilat with no rales, rhonchi, or wheezes  Heart: Auscultation: RRR.  Abdomen: Suprapubic tenderness  Extremities:  BL LE: No pretibial edema normal ROM and strength.  Mental Status: Judgment, insight: Intact Orientation: Oriented to time, place, and person Mood and affect: No depression, anxiety, or agitation     DATA REVIEWED: Results for GALI, SPINNEY (MRN Juline Patch) as of 06/20/2020 10:29  Ref. Range 06/19/2020 10:47  TSH Latest Ref Range: 0.40 - 4.50 mIU/L 1.67     12/22/2019 TSH 3.22 uiu/mL Total t4 12.7 ( 5.14-11.9)   ASSESSMENT / PLAN / RECOMMENDATIONS:   Hypothyroidism:   - Pt continues with fatigue  - Pt educated extensively on the correct way to take levothyroxine (first thing in the morning with water, 30 minutes before eating or taking other medications). - Pt encouraged to double dose the following day if she were to miss a dose given long half-life of levothyroxine.     Medications   Levothyroxine 50 mcg daily   F/U in 4 months    Signed electronically by: 11-16-58, MD  [facility]  [facility] [address] [place] [place], [place] Phone: [phone] FAX: [phone]      CC: [phone], MD [address] [place] Phone: [phone]  Fax: [phone]   Return to Endocrinology clinic as below: Future Appointments  Date Time Provider Department Center  07/09/2020 10:00 AM 07/11/2020, [facility] None  07/17/2020  2:00 PM 07/19/2020 T, PA-C [facility] None

## 2020-06-20 ENCOUNTER — Ambulatory Visit: Admitting: Internal Medicine

## 2020-06-20 LAB — TSH: TSH: 1.67 mIU/L (ref 0.40–4.50)

## 2020-06-20 MED ORDER — LEVOTHYROXINE SODIUM 50 MCG PO TABS
50.0000 ug | ORAL_TABLET | Freq: Every day | ORAL | 1 refills | Status: DC
Start: 2020-06-20 — End: 2020-09-13

## 2020-06-21 ENCOUNTER — Ambulatory Visit: Admitting: Physician Assistant

## 2020-07-09 ENCOUNTER — Ambulatory Visit (INDEPENDENT_AMBULATORY_CARE_PROVIDER_SITE_OTHER): Admitting: Professional

## 2020-07-09 DIAGNOSIS — F4310 Post-traumatic stress disorder, unspecified: Secondary | ICD-10-CM | POA: Diagnosis not present

## 2020-07-09 DIAGNOSIS — F331 Major depressive disorder, recurrent, moderate: Secondary | ICD-10-CM | POA: Diagnosis not present

## 2020-07-17 ENCOUNTER — Encounter: Payer: Self-pay | Admitting: Physician Assistant

## 2020-07-17 ENCOUNTER — Ambulatory Visit (INDEPENDENT_AMBULATORY_CARE_PROVIDER_SITE_OTHER): Admitting: Physician Assistant

## 2020-07-17 ENCOUNTER — Other Ambulatory Visit: Payer: Self-pay

## 2020-07-17 VITALS — BP 159/96 | HR 107 | Ht 62.0 in | Wt 171.0 lb

## 2020-07-17 DIAGNOSIS — F5105 Insomnia due to other mental disorder: Secondary | ICD-10-CM | POA: Diagnosis not present

## 2020-07-17 DIAGNOSIS — R454 Irritability and anger: Secondary | ICD-10-CM | POA: Diagnosis not present

## 2020-07-17 DIAGNOSIS — F411 Generalized anxiety disorder: Secondary | ICD-10-CM

## 2020-07-17 DIAGNOSIS — F3163 Bipolar disorder, current episode mixed, severe, without psychotic features: Secondary | ICD-10-CM

## 2020-07-17 DIAGNOSIS — F99 Mental disorder, not otherwise specified: Secondary | ICD-10-CM

## 2020-07-17 MED ORDER — OXCARBAZEPINE 300 MG PO TABS: ORAL_TABLET | ORAL | 1 refills | Status: DC

## 2020-07-17 MED ORDER — LAMOTRIGINE 25 MG PO TABS: ORAL_TABLET | ORAL | 1 refills | Status: DC
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
## 2020-07-18 ENCOUNTER — Telehealth: Payer: Self-pay | Admitting: Physician Assistant

## 2020-07-18 NOTE — Telephone Encounter (Signed)
Informed patient about her Rx's and she was very pleased I reached out to let her know. She did get notified Rx's were ready.

## 2020-07-18 NOTE — Telephone Encounter (Signed)
Contacted pharmacy and he said their computer system was down this morning, it was nothing related to doctor office or insurance. It's up and running now.

## 2020-07-18 NOTE — Telephone Encounter (Signed)
Pt called and left a message stating that she was having problems with getting one of her medicine due to insurance. Please call her at [phone]

## 2020-07-22 ENCOUNTER — Encounter: Payer: Self-pay | Admitting: Physician Assistant

## 2020-07-22 DIAGNOSIS — F411 Generalized anxiety disorder: Secondary | ICD-10-CM | POA: Insufficient documentation

## 2020-07-22 DIAGNOSIS — F319 Bipolar disorder, unspecified: Secondary | ICD-10-CM | POA: Insufficient documentation

## 2020-07-22 DIAGNOSIS — F99 Mental disorder, not otherwise specified: Secondary | ICD-10-CM | POA: Insufficient documentation

## 2020-07-22 DIAGNOSIS — R454 Irritability and anger: Secondary | ICD-10-CM | POA: Insufficient documentation

## 2020-07-24 ENCOUNTER — Telehealth: Payer: Self-pay | Admitting: Physician Assistant

## 2020-07-24 NOTE — Telephone Encounter (Signed)
Patient called about the Oxcarbazepine she was given to take. After reading about it and the side effects, she doesn't feel comfortable taking it. Please call her at [phone].

## 2020-07-24 NOTE — Telephone Encounter (Signed)
Please review

## 2020-07-25 NOTE — Telephone Encounter (Signed)
Okay.  I am not exactly sure what she wants me to do.  She and I talked about Depakote, Tegretol, and the atypical antipsychotics, all are more likely to cause weight gain, which she was adamant against, and flat out told me she would not take.  All medications have possible side effects, we have to weigh the pros versus the cons and decide what is best.

## 2020-07-31 ENCOUNTER — Ambulatory Visit: Admitting: Professional

## 2020-07-31 NOTE — Telephone Encounter (Signed)
Rtc to patient and she did decide to go ahead and start Trileptal, once she increased to bid she became nauseated all day until 4 pm. She's also very sleepy. Her second dose she was taking in the afternoon instead of bedtime according to the directions on her medication bottle. Advised her to take at bedtime with her other medication and hold off on the 1 tablet in the morning. She agreed and will call back if side effects not improving.

## 2020-07-31 NOTE — Telephone Encounter (Signed)
I agree.  Thanks Traci.

## 2020-08-27 ENCOUNTER — Ambulatory Visit: Admitting: Professional

## 2020-08-29 ENCOUNTER — Ambulatory Visit: Admitting: Professional

## 2020-08-30 ENCOUNTER — Ambulatory Visit (INDEPENDENT_AMBULATORY_CARE_PROVIDER_SITE_OTHER): Admitting: Physician Assistant

## 2020-08-30 ENCOUNTER — Encounter: Payer: Self-pay | Admitting: Physician Assistant

## 2020-08-30 ENCOUNTER — Other Ambulatory Visit: Payer: Self-pay

## 2020-08-30 DIAGNOSIS — F5105 Insomnia due to other mental disorder: Secondary | ICD-10-CM

## 2020-08-30 DIAGNOSIS — F3130 Bipolar disorder, current episode depressed, mild or moderate severity, unspecified: Secondary | ICD-10-CM

## 2020-08-30 DIAGNOSIS — F411 Generalized anxiety disorder: Secondary | ICD-10-CM

## 2020-08-30 DIAGNOSIS — F99 Mental disorder, not otherwise specified: Secondary | ICD-10-CM | POA: Diagnosis not present

## 2020-08-30 MED ORDER — CLONAZEPAM 0.5 MG PO TABS: 0.5000 mg | ORAL_TABLET | Freq: Two times a day (BID) | ORAL | 1 refills | Status: DC | PRN

## 2020-08-30 NOTE — Progress Notes (Signed)
[facility]  Patient ID: Emma [name],  MRN: [id number]  PCP: Leilani Able, MD  Date of Evaluation: 08/30/2020 Time spent:30 minutes  Chief Complaint:  Chief Complaint    Anxiety; Depression; Insomnia      HISTORY/CURRENT STATUS: HPI For routine med check.  LOV about 6 wks ago we started Lamictal and Trileptal. The Trileptal caused severe nausea. After decreasing back to 1 in the evening, but still had severe nausea and dizziness for several hours. She d/c it.   Still depressed. Continues to have probs with her adult daughter, hurt her feelings but she's learned to just walk away. Her adult son and his family treat her well. Does enjoy things sometimes. Energy and motivation are good. Not isolating. Feels guilty a lot. Appetite and weight are stable. Not crying easily. She went to [facility] to see family over the holidays, and that was ok. Before that, she was sleeping more. Had purpose while in [place], and slept better. No SI/HI.   Klonopin does help when she anxious, which depends on the situation.  When she is around her daughter, she definitely needs one.  She usually takes 1 Klonopin a day but sometimes she does not need that.  It is a rare occasion that she has to take 2.  She  No increased energy with decreased need for sleep. No episodes/meltdowns since LOV. No impulsivity or risky behavior. No grandiosity. No increased spending or libido. No paranoia, no AH/VH.   Individual Medical History/ Review of Systems: Changes? :No   Allergies: Oxcarbazepine and Codeine  Current Medications:  Current Outpatient Medications:  .  cetirizine (ZYRTEC) 10 MG tablet, Take 10 mg by mouth daily., Disp: , Rfl:  .  DULoxetine (CYMBALTA) 20 MG capsule, Take 40 mg by mouth daily. , Disp: , Rfl:  .  estradiol (ESTRACE) 2 MG tablet, Take 2 mg by mouth daily. , Disp: , Rfl:  .  Eszopiclone 3 MG TABS, Take 3 mg by mouth at bedtime as needed for sleep. Take immediately before bedtime, Disp: , Rfl:   .  levothyroxine (SYNTHROID) 50 MCG tablet, Take 1 tablet (50 mcg total) by mouth daily., Disp: 90 tablet, Rfl: 1 .  meloxicam (MOBIC) 15 MG tablet, Take 15 mg by mouth daily., Disp: , Rfl:  .  Omega-3 Fatty Acids (FISH OIL) 1000 MG CAPS, Take by mouth., Disp: , Rfl:  .  omeprazole (PRILOSEC) 40 MG capsule, Take 40 mg by mouth daily. , Disp: , Rfl:  .  Probiotic Product (PROBIOTIC-10 PO), Take by mouth., Disp: , Rfl:  .  Vitamin D, Ergocalciferol, (DRISDOL) 50000 units CAPS capsule, Take 50,000 Units by mouth every 7 (seven) days., Disp: , Rfl:  .  vitamin E 1000 UNIT capsule, Take 1,000 Units by mouth daily., Disp: , Rfl:  .  clonazePAM (KLONOPIN) 0.5 MG tablet, Take 1 tablet (0.5 mg total) by mouth 2 (two) times daily as needed for anxiety., Disp: 30 tablet, Rfl: 1 .  lamoTRIgine (LAMICTAL) 25 MG tablet, 1 p.o. nightly for 2 weeks and then 2 p.o. nightly (Patient not taking: Reported on 08/30/2020), Disp: 60 tablet, Rfl: 1 .  losartan-hydrochlorothiazide (HYZAAR) 50-12.5 MG tablet, Take 1 tablet by mouth daily.  (Patient not taking: No sig reported), Disp: , Rfl:  Medication Side Effects: nausea  Family Medical/ Social History: Changes? No  MENTAL HEALTH EXAM:  There were no vitals taken for this visit.There is no height or weight on file to calculate BMI.  General Appearance: Casual, Neat,  Well Groomed and Obese  Eye Contact:  Good  Speech:  Clear and Coherent and Normal Rate  Volume:  Normal  Mood:  Euthymic  Affect:  Appropriate  Thought Process:  Goal Directed and Descriptions of Associations: Intact  Orientation:  Full (Time, Place, and Person)  Thought Content: Logical   Suicidal Thoughts:  No  Homicidal Thoughts:  No  Memory:  WNL  Judgement:  Good  Insight:  Good  Psychomotor Activity:  Normal  Concentration:  Concentration: Good  Recall:  Good  Fund of Knowledge: Good  Language: Good  Assets:  Desire for Improvement  ADL's:  Intact  Cognition: WNL  Prognosis:  Good     DIAGNOSES:    ICD-10-CM   1. Bipolar I disorder, most recent episode depressed (HCC)  F31.30   2. Generalized anxiety disorder  F41.1   3. Insomnia due to other mental disorder  F51.05    F99     Receiving Psychotherapy: Yes    RECOMMENDATIONS:  PDMP was reviewed. I provided 30 minutes of face-to-face time during this encounter in which we discussed the side effect from what sounds like the Trileptal.  She has already discontinued that, along with the Lamictal because she was not sure which drug caused it.  I recommend restarting the Lamictal.  We again discussed to the possible the rare rash that can occur and she knows to call if she does get a rash. DC Trileptal.  She already has. Continue Cymbalta 20 mg, 2 p.o. daily. Continue Klonopin 0.5 mg, 1 p.o. twice daily as needed anxiety. Continue Lunesta 3 mg, 1 p.o. nightly as needed sleep. Restart Lamictal 25 mg, 1 p.o. nightly for 2 weeks and then increase to 2 p.o. nightly. Continue counseling. Return in 4 to 6 weeks.  Melony Overly, PA-C

## 2020-09-02 ENCOUNTER — Other Ambulatory Visit: Payer: Self-pay | Admitting: Physician Assistant

## 2020-09-03 ENCOUNTER — Telehealth: Payer: Self-pay | Admitting: Family Medicine

## 2020-09-06 NOTE — Telephone Encounter (Signed)
Medication:levothyroxine (SYNTHROID) 50 MCG tablet [[id number]]       Has the patient contacted their pharmacy? no  (If no, request that the patient contact the pharmacy for the refill.) (If yes, when and what did the pharmacy advise?)    Preferred Pharmacy (with phone number or street name):   [id number] - [place], [place] - [address] AT [facility] [address]  [address], [place] [address]  Phone:  [phone] Fax:  [phone]     Agent: Please be advised that RX refills may take up to 3 business days. We ask that you follow-up with your pharmacy.

## 2020-09-12 NOTE — Telephone Encounter (Signed)
Patient called in reference to medication refill for synthroid , patient states she called the pharmacy, patient states pharmacy has the prescription but has not filled it.

## 2020-09-12 NOTE — Telephone Encounter (Signed)
Patient is calling back to check the status of medication. Patient was told by pharmacist they don't have any refills. Please advise

## 2020-09-13 ENCOUNTER — Other Ambulatory Visit: Payer: Self-pay | Admitting: Internal Medicine

## 2020-09-13 ENCOUNTER — Ambulatory Visit (INDEPENDENT_AMBULATORY_CARE_PROVIDER_SITE_OTHER): Admitting: Professional

## 2020-09-13 ENCOUNTER — Other Ambulatory Visit: Payer: Self-pay | Admitting: Physician Assistant

## 2020-09-13 DIAGNOSIS — F331 Major depressive disorder, recurrent, moderate: Secondary | ICD-10-CM | POA: Diagnosis not present

## 2020-09-13 DIAGNOSIS — F4310 Post-traumatic stress disorder, unspecified: Secondary | ICD-10-CM

## 2020-09-13 NOTE — Telephone Encounter (Signed)
Noted. Spoken to patient as well. She had refill from 05/2020 that she did not pick up yet.

## 2020-10-03 ENCOUNTER — Other Ambulatory Visit: Payer: Self-pay | Admitting: Physician Assistant

## 2020-10-10 ENCOUNTER — Ambulatory Visit: Admitting: Professional

## 2020-10-17 ENCOUNTER — Ambulatory Visit (INDEPENDENT_AMBULATORY_CARE_PROVIDER_SITE_OTHER): Admitting: Physician Assistant

## 2020-10-17 ENCOUNTER — Encounter: Payer: Self-pay | Admitting: Physician Assistant

## 2020-10-17 ENCOUNTER — Other Ambulatory Visit: Payer: Self-pay

## 2020-10-17 DIAGNOSIS — F411 Generalized anxiety disorder: Secondary | ICD-10-CM

## 2020-10-17 DIAGNOSIS — R454 Irritability and anger: Secondary | ICD-10-CM

## 2020-10-17 DIAGNOSIS — F3130 Bipolar disorder, current episode depressed, mild or moderate severity, unspecified: Secondary | ICD-10-CM

## 2020-10-17 NOTE — Progress Notes (Signed)
[facility]  Patient ID: Emma [name],  MRN: [id number]  PCP: Leilani Able, MD  Date of Evaluation: 10/17/2020 Time spent:40 minutes  Chief Complaint:  Chief Complaint    Anxiety; Depression; Insomnia; Follow-up      HISTORY/CURRENT STATUS: HPI For routine med check.  Wasn't able to tolerate the Lamictal, caused insomnia. Took it 3 weeks and it never improved, even with the Lunesta. Also made her hungry and didn't like that. Only used it at night as was directed. Cymbalta causes increased heat/sweating when goes over 40 mg.   Has been more down. Feels sad for no real reason. Decreased personal hygiene, might go 3 days without a shower. Doesn't clean house, her husband does it. Has a trip planned to go to [facility] next week. Was happy about it at first, but not so much now, but she'll go. "I know I have to get out of the house, so I push myself." Doesn't cook.  Feels guilty but it doesn't matter to her. Klonopin does help when she anxious. Sleeps a lot. No SI/HI.  Patient denies increased energy with decreased need for sleep, no increased talkativeness, no racing thoughts, no impulsivity or risky behaviors, no increased spending, no increased libido, no grandiosity, no increased irritability or anger, no paranoia, and no hallucinations.  Denies dizziness, syncope, seizures, numbness, tingling, tremor, tics, unsteady gait, slurred speech, confusion. Denies muscle or joint pain, stiffness, or dystonia.  Individual Medical History/ Review of Systems: Changes? :No    Past medications for mental health diagnoses include: Cymbalta causes sweating/hot flashes above 40 mg, Prozac worked for [employer], Effexor caused her to be a zombie, Wellbutrin caused anger, Klonopin, Lunesta, Zoloft, Ambien, Trazodone, Trileptal caused severe nausea, Lamictal caused insomnia.  Allergies: Oxcarbazepine and Codeine  Current Medications:  Current Outpatient Medications:  .  cetirizine (ZYRTEC) 10 MG  tablet, Take 10 mg by mouth daily., Disp: , Rfl:  .  clonazePAM (KLONOPIN) 0.5 MG tablet, Take 1 tablet (0.5 mg total) by mouth 2 (two) times daily as needed for anxiety., Disp: 30 tablet, Rfl: 1 .  DULoxetine (CYMBALTA) 20 MG capsule, Take 40 mg by mouth daily. , Disp: , Rfl:  .  estradiol (ESTRACE) 2 MG tablet, Take 2 mg by mouth daily. , Disp: , Rfl:  .  Eszopiclone 3 MG TABS, Take 3 mg by mouth at bedtime as needed for sleep. Take immediately before bedtime, Disp: , Rfl:  .  levothyroxine (SYNTHROID) 50 MCG tablet, TAKE 1 TABLET(50 MCG) BY MOUTH DAILY, Disp: 90 tablet, Rfl: 1 .  losartan-hydrochlorothiazide (HYZAAR) 50-12.5 MG tablet, Take 1 tablet by mouth daily., Disp: , Rfl:  .  meloxicam (MOBIC) 15 MG tablet, Take 15 mg by mouth daily., Disp: , Rfl:  .  Omega-3 Fatty Acids (FISH OIL) 1000 MG CAPS, Take by mouth., Disp: , Rfl:  .  omeprazole (PRILOSEC) 40 MG capsule, Take 40 mg by mouth daily. , Disp: , Rfl:  .  Probiotic Product (PROBIOTIC-10 PO), Take by mouth., Disp: , Rfl:  .  Vitamin D, Ergocalciferol, (DRISDOL) 50000 units CAPS capsule, Take 50,000 Units by mouth every 7 (seven) days., Disp: , Rfl:  .  vitamin E 1000 UNIT capsule, Take 1,000 Units by mouth daily., Disp: , Rfl:  Medication Side Effects: insomnia  Family Medical/ Social History: Changes? No  MENTAL HEALTH EXAM:  There were no vitals taken for this visit.There is no height or weight on file to calculate BMI.  General Appearance: Casual, Neat, Well Groomed and  Obese  Eye Contact:  Good  Speech:  Clear and Coherent and Normal Rate  Volume:  Normal  Mood:  Euthymic  Affect:  Appropriate  Thought Process:  Goal Directed and Descriptions of Associations: Intact  Orientation:  Full (Time, Place, and Person)  Thought Content: Logical   Suicidal Thoughts:  No  Homicidal Thoughts:  No  Memory:  WNL  Judgement:  Good  Insight:  Good  Psychomotor Activity:  Normal  Concentration:  Concentration: Good  Recall:  Good   Fund of Knowledge: Good  Language: Good  Assets:  Desire for Improvement  ADL's:  Intact  Cognition: WNL  Prognosis:  Good    DIAGNOSES:    ICD-10-CM   1. Bipolar I disorder, most recent episode depressed (HCC)  F31.30   2. Irritability and anger  R45.4   3. Generalized anxiety disorder  F41.1     Receiving Psychotherapy: Yes    RECOMMENDATIONS:  PDMP was reviewed. I provided 40 minutes of face-to-face time during this encounter, in which we discussed the side effects she is having to the Lamictal.  She has already stopped it.  Time was spent before and after the face-to-face visit reviewing her chart including labs.   We also discussed GeneSight testing at length including the pros and cons and she would like to have it done.  Specimen was obtained.   We agreed to make no changes until we get the results of this back.  It will help me know what medication to prescribe and definitely what not to prescribe.  We will also get her MTHFR status and can treat that if needed. Sleep hygiene was disc. Continue Cymbalta 20 mg, 2 p.o. daily. Continue Klonopin 0.5 mg, 1 p.o. twice daily as needed anxiety. Continue Lunesta 3 mg, 1 p.o. nightly as needed sleep. Continue counseling. Return in 6 weeks.  Melony Overly, PA-C

## 2020-10-22 ENCOUNTER — Other Ambulatory Visit: Payer: Self-pay

## 2020-10-23 ENCOUNTER — Ambulatory Visit: Admitting: Internal Medicine

## 2020-10-24 ENCOUNTER — Ambulatory Visit (INDEPENDENT_AMBULATORY_CARE_PROVIDER_SITE_OTHER): Admitting: Internal Medicine

## 2020-10-24 ENCOUNTER — Encounter: Payer: Self-pay | Admitting: Internal Medicine

## 2020-10-24 ENCOUNTER — Other Ambulatory Visit: Payer: Self-pay

## 2020-10-24 VITALS — BP 140/84 | HR 112 | Ht 62.0 in | Wt 180.2 lb

## 2020-10-24 DIAGNOSIS — E039 Hypothyroidism, unspecified: Secondary | ICD-10-CM

## 2020-10-24 NOTE — Progress Notes (Signed)
Name: Chayna Surratt  MRN/ DOB: [id number], Apr 11, 1961    Age/ Sex: 60 y.o., female     PCP: Leilani Able, MD   Reason for Endocrinology Evaluation: Hypothyroidism     Initial Endocrinology Clinic Visit: 03/13/2020    PATIENT IDENTIFIER: Ms. Lolamae Voisin is a 60 y.o., female with a past medical history of HTN, depression and anxiety. She has followed with [facility] since 03/13/2020 for consultative assistance with management of her Hypothyroidism.   HISTORICAL SUMMARY:  Pt presented to her PCP with c/o hair loss, dry skin and fatigue in 11/2019 . TSH was normal at the time ( she was on Biotin). Repeat TSH at our office in 02/2020 was noted to have a TSH of 7.49 uIU/mL and was started on LT-4 replacement at the time.   Sister with Graves' Disease  SUBJECTIVE:    Today (10/24/2020):  Ms. Pommier is here for a follow up on hypothyroidism .   Weight has been stable  Sees a therapist for depression  Continues with fatigue  Has cold intolerance ,and  hair loss  Takes stool softeners for constipation    Has been having eye issues, was seen by 2 eye doctors was told she has dry eyes. She had seen the commercials on   No local neck symptoms    She is on Levothyroxine 50 mcg daily   Not on Biotin    HISTORY:  Past Medical History:  Past Medical History:  Diagnosis Date  . Anxiety   . Asthma   . GERD (gastroesophageal reflux disease)   . Hypertension   . PONV (postoperative nausea and vomiting)   . Sleep apnea    non compliant with CPAP  . Thyroid disease    Past Surgical History:  Past Surgical History:  Procedure Laterality Date  . ABDOMINAL HYSTERECTOMY    . CHOLECYSTECTOMY    . JOINT REPLACEMENT    . SHOULDER ARTHROSCOPY WITH SUBACROMIAL DECOMPRESSION, ROTATOR CUFF REPAIR AND BICEP TENDON REPAIR Left 04/01/2019   Procedure: LEFT SHOULDER ARTHROSCOPY WITH SUBACROMIAL DECOMPRESSION, DISTAL CLAVICLE EXCISION, BICEP TENODESIS, ROTATOR CUFF REPAIR, spinal needle  decrompression of glenoid cyst;  Surgeon: Sheral Apley, MD;  Location: [facility];  Service: Orthopedics;  Laterality: Left;   Social History:  reports that she quit smoking about 11 years ago. She has never used smokeless tobacco. She reports current alcohol use. She reports that she does not use drugs. Family History:  Family History  Problem Relation Age of Onset  . Breast cancer Mother 16  . Dementia Mother   . Drug abuse Mother   . Breast cancer Sister 63  . Stomach cancer Father   . Heart disease Father   . Obesity Brother   . Breast cancer Paternal Grandmother      HOME MEDICATIONS: Allergies as of 10/24/2020      Reactions   Oxcarbazepine Nausea Only   Codeine Nausea Only      Medication List       Accurate as of October 24, 2020  7:43 AM. If you have any questions, ask your nurse or doctor.        cetirizine 10 MG tablet Commonly known as: ZYRTEC Take 10 mg by mouth daily.   clonazePAM 0.5 MG tablet Commonly known as: KLONOPIN Take 1 tablet (0.5 mg total) by mouth 2 (two) times daily as needed for anxiety.   DULoxetine 20 MG capsule Commonly known as: CYMBALTA Take 40 mg by mouth daily.  estradiol 2 MG tablet Commonly known as: ESTRACE Take 2 mg by mouth daily.   Eszopiclone 3 MG Tabs Take 3 mg by mouth at bedtime as needed for sleep. Take immediately before bedtime   Fish Oil 1000 MG Caps Take by mouth.   levothyroxine 50 MCG tablet Commonly known as: SYNTHROID TAKE 1 TABLET(50 MCG) BY MOUTH DAILY   losartan-hydrochlorothiazide 50-12.5 MG tablet Commonly known as: HYZAAR Take 1 tablet by mouth daily.   meloxicam 15 MG tablet Commonly known as: MOBIC Take 15 mg by mouth daily.   omeprazole 40 MG capsule Commonly known as: PRILOSEC Take 40 mg by mouth daily.   PROBIOTIC-10 PO Take by mouth.   Vitamin D (Ergocalciferol) 1.25 MG (50000 UNIT) Caps capsule Commonly known as: DRISDOL Take 50,000 Units by mouth every 7  (seven) days.   vitamin E 1000 UNIT capsule Take 1,000 Units by mouth daily.         OBJECTIVE:   PHYSICAL EXAM: VS: BP 140/84   Pulse (!) 112   Ht 5\' 2"  (1.575 m)   Wt 180 lb 4 oz (81.8 kg)   SpO2 98%   BMI 32.97 kg/m    EXAM: General: Pt appears well and is in NAD  Neck: General: Supple without adenopathy. Thyroid: Thyroid size normal.  No goiter or nodules appreciated.  Lungs: Clear with good BS bilat with no rales, rhonchi, or wheezes  Heart: Auscultation: RRR.  Mental Status: Judgment, insight: Intact Orientation: Oriented to time, place, and person Mood and affect: No depression, anxiety, or agitation     DATA REVIEWED: Results for ANALYN, MATUSEK (MRN Juline Patch) as of 10/25/2020 10:40  Ref. Range 10/24/2020 07:55  TSH Latest Ref Range: 0.35 - 4.50 uIU/mL 2.20     12/22/2019 TSH 3.22 uiu/mL Total t4 12.7 ( 5.14-11.9)   ASSESSMENT / PLAN / RECOMMENDATIONS:   Hypothyroidism:   - Pt with multiple non-specific symptoms  - Pt educated extensively on the correct way to take levothyroxine (first thing in the morning with water, 30 minutes before eating or taking other medications). - Pt encouraged to double dose the following day if she were to miss a dose given long half-life of levothyroxine. - TSH is normal      Medications   Continue Levothyroxine 50 mcg daily   F/U in 4 months    Signed electronically by: 11-15-2005, MD  [facility]  [facility] [address] [place] [place], [place] Phone: [phone] FAX: [phone]      CC: [phone], MD [address] [place] Phone: [phone]  Fax: [phone]   Return to Endocrinology clinic as below: Future Appointments  Date Time Provider Department Center  12/26/2020 11:00 AM 02/25/2021 T, PA-C [facility]-CP None

## 2020-10-24 NOTE — Patient Instructions (Signed)

## 2020-10-25 LAB — TSH: TSH: 2.2 u[IU]/mL (ref 0.35–4.50)

## 2020-10-25 LAB — THYROID PEROXIDASE ANTIBODY: Thyroperoxidase Ab SerPl-aCnc: 1 IU/mL (ref <9)

## 2020-10-25 MED ORDER — LEVOTHYROXINE SODIUM 50 MCG PO TABS
50.0000 ug | ORAL_TABLET | Freq: Every day | ORAL | 3 refills | Status: DC
Start: 2020-10-25 — End: 2021-09-03

## 2020-11-26 ENCOUNTER — Encounter: Payer: Self-pay | Admitting: Physician Assistant

## 2020-12-03 ENCOUNTER — Other Ambulatory Visit: Payer: Self-pay | Admitting: Physician Assistant

## 2020-12-04 ENCOUNTER — Other Ambulatory Visit: Payer: Self-pay | Admitting: Family Medicine

## 2020-12-04 DIAGNOSIS — Z1231 Encounter for screening mammogram for malignant neoplasm of breast: Secondary | ICD-10-CM

## 2020-12-04 NOTE — Telephone Encounter (Signed)
Next apt 5/04 

## 2020-12-17 ENCOUNTER — Ambulatory Visit: Admitting: Physician Assistant

## 2020-12-26 ENCOUNTER — Encounter: Payer: Self-pay | Admitting: Physician Assistant

## 2020-12-26 ENCOUNTER — Ambulatory Visit (INDEPENDENT_AMBULATORY_CARE_PROVIDER_SITE_OTHER): Admitting: Physician Assistant

## 2020-12-26 ENCOUNTER — Other Ambulatory Visit: Payer: Self-pay

## 2020-12-26 DIAGNOSIS — R454 Irritability and anger: Secondary | ICD-10-CM

## 2020-12-26 DIAGNOSIS — F5105 Insomnia due to other mental disorder: Secondary | ICD-10-CM | POA: Diagnosis not present

## 2020-12-26 DIAGNOSIS — F411 Generalized anxiety disorder: Secondary | ICD-10-CM

## 2020-12-26 DIAGNOSIS — F99 Mental disorder, not otherwise specified: Secondary | ICD-10-CM

## 2020-12-26 DIAGNOSIS — F3163 Bipolar disorder, current episode mixed, severe, without psychotic features: Secondary | ICD-10-CM

## 2020-12-26 MED ORDER — CLONAZEPAM 0.5 MG PO TABS: ORAL_TABLET | ORAL | 5 refills | Status: DC

## 2020-12-26 MED ORDER — LURASIDONE HCL 40 MG PO TABS: 40.0000 mg | ORAL_TABLET | Freq: Every day | ORAL | 1 refills | Status: DC

## 2020-12-26 NOTE — Patient Instructions (Signed)
Wean off Cymbalta 20 mg by taking 1 pill daily for 4 days and then stop it. At the same time start the Latuda 20 mg samples with supper and then in 2 weeks we will start the Latuda 40 mg that I sent in to the pharmacy.

## 2020-12-26 NOTE — Progress Notes (Signed)
[facility]  Patient ID: Emma [name],  MRN: [id number]  PCP: Leilani Able, MD  Date of Evaluation: 12/26/2020 Time spent:40 minutes  Chief Complaint:  Chief Complaint    Anxiety; Depression; Insomnia; Follow-up      HISTORY/CURRENT STATUS: HPI  For routine med check.  Feels more tired than not. Cymbalta hasn't helped at all. Sleeps for a few days, sometimes will not shower for several days, then will get up for a few days. Puts off getting groceries, laundry etc. If she absolutely has to do something, she will. But not usually. When she does, will have to recover by sleeping. Doesn't cook, not eating junk though.  Anxiety is still present but she does not take the Klonopin very often.  No SI/HI.   Again states that she will not take any medication if she gains any weight.  She has a very strong family history of morbid obesity.  States that her sister was 400- 500 pounds.  Got very angry with sister recently when she went to visit and help her. Patient denies increased energy with decreased need for sleep, no increased talkativeness, no racing thoughts, no impulsivity or risky behaviors, no increased spending, no increased libido, no grandiosity, the irritability and anger outbursts are not as frequent or as severe now, no paranoia, and no hallucinations.  Denies dizziness, syncope, seizures, numbness, tingling, tremor, tics, unsteady gait, slurred speech, confusion. Denies muscle or joint pain, stiffness, or dystonia.  Individual Medical History/ Review of Systems: Changes? :No   Past medications for mental health diagnoses include: Cymbalta causes sweating/hot flashes above 40 mg, Prozac worked for [employer], Effexor caused her to be a zombie, Wellbutrin caused anger, Klonopin, Lunesta, Zoloft, Ambien, Trazodone, Trileptal caused severe nausea, Lamictal caused insomnia.  Allergies: Oxcarbazepine and Codeine  Current Medications:  Current Outpatient Medications:  .   cetirizine (ZYRTEC) 10 MG tablet, Take 10 mg by mouth daily., Disp: , Rfl:  .  DULoxetine (CYMBALTA) 20 MG capsule, Take 40 mg by mouth daily. , Disp: , Rfl:  .  estradiol (ESTRACE) 2 MG tablet, Take 2 mg by mouth daily. , Disp: , Rfl:  .  Eszopiclone 3 MG TABS, Take 3 mg by mouth at bedtime as needed for sleep. Take immediately before bedtime, Disp: , Rfl:  .  levothyroxine (SYNTHROID) 50 MCG tablet, Take 1 tablet (50 mcg total) by mouth daily before breakfast., Disp: 90 tablet, Rfl: 3 .  losartan-hydrochlorothiazide (HYZAAR) 50-12.5 MG tablet, Take 1 tablet by mouth daily., Disp: , Rfl:  .  lurasidone (LATUDA) 40 MG TABS tablet, Take 1 tablet (40 mg total) by mouth daily with supper., Disp: 30 tablet, Rfl: 1 .  meloxicam (MOBIC) 15 MG tablet, Take 15 mg by mouth daily., Disp: , Rfl:  .  Omega-3 Fatty Acids (FISH OIL) 1000 MG CAPS, Take by mouth., Disp: , Rfl:  .  omeprazole (PRILOSEC) 40 MG capsule, Take 40 mg by mouth daily. , Disp: , Rfl:  .  Probiotic Product (PROBIOTIC-10 PO), Take by mouth., Disp: , Rfl:  .  Vitamin D, Ergocalciferol, (DRISDOL) 50000 units CAPS capsule, Take 50,000 Units by mouth every 7 (seven) days., Disp: , Rfl:  .  vitamin E 1000 UNIT capsule, Take 1,000 Units by mouth daily., Disp: , Rfl:  .  clonazePAM (KLONOPIN) 0.5 MG tablet, TAKE 1 TABLET(0.5 MG) BY MOUTH TWICE DAILY AS NEEDED FOR ANXIETY, Disp: 30 tablet, Rfl: 5 Medication Side Effects: none  Family Medical/ Social History: Changes? no  MENTAL HEALTH  EXAM:  There were no vitals taken for this visit.There is no height or weight on file to calculate BMI.  General Appearance: Casual and Fairly Groomed  Eye Contact:  Good  Speech:  Clear and Coherent and Normal Rate  Volume:  Normal  Mood:  Depressed  Affect:  Congruent  Thought Process:  Goal Directed  Orientation:  Full (Time, Place, and Person)  Thought Content: Logical   Suicidal Thoughts:  No  Homicidal Thoughts:  No  Memory:  WNL  Judgement:  Good   Insight:  Good  Psychomotor Activity:  Normal  Concentration:  Concentration: Good  Recall:  Good  Fund of Knowledge: Good  Language: Good  Assets:  Communication Skills  ADL's:  Intact  Cognition: WNL  Prognosis:  Good   GeneSight test results are on chart under scan.   DIAGNOSES:    ICD-10-CM   1. Irritability and anger  R45.4   2. Generalized anxiety disorder  F41.1   3. Insomnia due to other mental disorder  F51.05    F99   4. Bipolar disorder, current episode mixed, severe, without psychotic features (HCC)  F31.63     Receiving Psychotherapy: No    RECOMMENDATIONS:  PDMP reviewed. I provided 40 mins of face to face time during this encounter, including time spent before and after the visit in records and charting. Discussed the Gene site test results. Out of the meds that are listed in the first column recommened Latuda.  Explained the benefits, risks and side effects and she accepts. Start Latuda 20 mg, 1 p.o. q. evening with at least 350 cal. Continue Klonopin 0.5 mg 1 p.o. twice daily as needed. Weaning off of Cymbalta 20 mg by taking 1 daily for 4 days and then stop it.  Counseling. Return in 6-8 weeks.  Melony Overly, PA-C

## 2021-01-23 ENCOUNTER — Telehealth: Payer: Self-pay | Admitting: Physician Assistant

## 2021-01-23 NOTE — Telephone Encounter (Signed)
Pt wants to stop taking Latuda- been on over three weeks. Had headaches every day and didn't help her anxiety and irritability. She craved sugar on Latuda. Felt like she was very lethargic and felt 'hot' inside. Maybe go back on to Cymbalta or try a new medication?

## 2021-01-23 NOTE — Telephone Encounter (Signed)
Please review

## 2021-01-23 NOTE — Telephone Encounter (Signed)
If she has not already done so, she can stop the Latuda, no need to wean. Our options would be for her to go back on Cymbalta, or start Rexulti.  The Cymbalta was not working well enough before so I do not think it would really be any different now.  My first recommendation would be the Rexulti.  Please let her notes in the same class of medications as the Latuda, it is one of the newer ones, and does not cause weight gain like many of the other medications do.  She is adamant about not taking something that will cause her to gain weight, and I understand.

## 2021-01-24 ENCOUNTER — Other Ambulatory Visit: Payer: Self-pay

## 2021-01-24 ENCOUNTER — Ambulatory Visit
Admission: RE | Admit: 2021-01-24 | Discharge: 2021-01-24 | Disposition: A | Discharge status: Home / Self Care | Source: Ambulatory Visit | Attending: Family Medicine | Admitting: Family Medicine

## 2021-01-24 DIAGNOSIS — Z1231 Encounter for screening mammogram for malignant neoplasm of breast: Secondary | ICD-10-CM

## 2021-01-25 NOTE — Telephone Encounter (Signed)
I have been trying to reach pt for 2 days now.LVM

## 2021-01-25 NOTE — Telephone Encounter (Signed)
noted 

## 2021-01-30 NOTE — Telephone Encounter (Signed)
I'm closing out this note since she hasn't returned calls.

## 2021-02-20 ENCOUNTER — Other Ambulatory Visit: Payer: Self-pay

## 2021-02-20 ENCOUNTER — Ambulatory Visit (INDEPENDENT_AMBULATORY_CARE_PROVIDER_SITE_OTHER): Admitting: Physician Assistant

## 2021-02-20 ENCOUNTER — Encounter: Payer: Self-pay | Admitting: Physician Assistant

## 2021-02-20 DIAGNOSIS — G4733 Obstructive sleep apnea (adult) (pediatric): Secondary | ICD-10-CM | POA: Diagnosis not present

## 2021-02-20 DIAGNOSIS — R5383 Other fatigue: Secondary | ICD-10-CM

## 2021-02-20 DIAGNOSIS — F3130 Bipolar disorder, current episode depressed, mild or moderate severity, unspecified: Secondary | ICD-10-CM

## 2021-02-20 DIAGNOSIS — R454 Irritability and anger: Secondary | ICD-10-CM | POA: Diagnosis not present

## 2021-02-20 DIAGNOSIS — R5381 Other malaise: Secondary | ICD-10-CM | POA: Diagnosis not present

## 2021-02-20 MED ORDER — SUNOSI 150 MG PO TABS: 150.0000 mg | ORAL_TABLET | Freq: Every morning | ORAL | 1 refills | Status: DC

## 2021-02-20 NOTE — Progress Notes (Signed)
[facility]  Patient ID: Evangelynn Lochridge,  MRN: [id number]  PCP: Leilani Able, MD  Date of Evaluation: 02/20/2021 Time spent:40 minutes  Chief Complaint:  Chief Complaint   Anxiety; Depression; Follow-up     HISTORY/CURRENT STATUS: HPI  For routine med check.  Sad and stressed b/c sister is on hospice and her niece (that sister's dtr) died from MI a few weeks ago.  She's been in [place] the past few weeks for those reasons.  Latuda gave her a headache that did not go away over about 10 days time.  She was also extremely hot.  Not like a hot flash but more of an internal sense of heat that would not go away.  She was not sick in any other way.  No fever, achiness, cough or cold symptoms for example.  No abdominal pain, nausea or vomiting, constipation or diarrhea.  No urinary tract symptoms.  She called in after having been on it for about 7 to 10 days and we decided to stop it, got her back on Cymbalta.  Has OSA. Uses CPAP religiously.  That has helped her with improved quality of sleep, but she can rollover, turn off her alarm and fall back to sleep.  Reports sleeping 18 hours a day if she lets herself.  She has not been using Lunesta.  Has not needed it.  She is able to enjoy things when she gets the chance.  She is looking forward to visiting friends in [place] for a month part of July and August.  She just has no motivation or energy when she gets up.  Does not cry easily.  Isolates but mostly because she sleeps all the time.  Appetite is normal and weight is stable.  She remains adamant about not being put on any kind of medication that causes weight gain.  No suicidal or homicidal thoughts.  Does have some irritability but no worse than normal.  Some of it she believes it is justified because of situation in her family, with their health issues, states most of them smoke and they have brought the illnesses on themselves.  That makes her sad and mad at the same time.  No increased  energy with decreased need for sleep, no impulsivity or risky behavior.  No grandiosity.  No paranoia or hallucinations.  She does continue to have anxiety, mostly generalized.  She does not take the Klonopin on a daily basis but it is helpful when she needs it.  Review of Systems  Constitutional:  Positive for malaise/fatigue.       See HPI  HENT: Negative.    Eyes: Negative.   Respiratory: Negative.    Cardiovascular: Negative.   Gastrointestinal: Negative.   Genitourinary: Negative.   Musculoskeletal: Negative.   Skin: Negative.   Neurological: Negative.   Endo/Heme/Allergies: Negative.   Psychiatric/Behavioral:         See HPI   Individual Medical History/ Review of Systems: Changes? 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 , Disp: , Rfl:    estradiol (ESTRACE) 2 MG tablet, Take 2 mg by mouth daily. , Disp: , Rfl:    Eszopiclone 3 MG TABS, Take 3 mg by mouth at bedtime as needed for sleep. Take immediately before bedtime, Disp: , Rfl:    fluticasone (FLONASE) 50 MCG/ACT nasal spray, , Disp: , Rfl:    levothyroxine (SYNTHROID) 50 MCG tablet, Take 1 tablet (50 mcg total) by mouth daily before breakfast., Disp: 90 tablet, Rfl: 3   losartan-hydrochlorothiazide (HYZAAR) 50-12.5 MG tablet, Take 1  tablet by mouth daily., Disp: , Rfl:    meloxicam (MOBIC) 15 MG tablet, Take 15 mg by mouth daily., Disp: , Rfl:    Omega-3 Fatty Acids (FISH OIL) 1000 MG CAPS, Take by mouth., Disp: , Rfl:    omeprazole (PRILOSEC) 40 MG capsule, Take 40 mg by mouth daily. , Disp: , Rfl:    Probiotic Product (PROBIOTIC-10 PO), Take by mouth., Disp: , Rfl:    Solriamfetol HCl (SUNOSI) 150 MG TABS, Take 150 mg by mouth every morning., Disp: 30 tablet, Rfl: 1   Vitamin D, Ergocalciferol, (DRISDOL) 50000 units CAPS capsule, Take 50,000 Units by mouth every 7 (seven) days., Disp: , Rfl:    vitamin E 1000 UNIT capsule, Take 1,000 Units by mouth daily., Disp: , Rfl:  Medication Side Effects: none  Family Medical/ Social History: Changes?  Sister is on Hospice now. Lung cancer. Niece died from MI recently at 26 years old.   MENTAL HEALTH EXAM:  There were no vitals taken for this visit.There is no height or weight on file to calculate BMI.  General Appearance: Casual, Fairly Groomed, and Obese  Eye Contact:  Good  Speech:  Clear and Coherent and Normal Rate  Volume:  Normal  Mood:  Depressed  Affect:  Congruent  Thought Process:  Goal Directed  Orientation:  Full (Time, Place, and Person)  Thought Content: Logical   Suicidal Thoughts:  No  Homicidal Thoughts:  No  Memory:  WNL  Judgement:  Good  Insight:  Good  Psychomotor Activity:  Normal  Concentration:  Concentration: Good and Attention Span: Good  Recall:  Good  Fund of Knowledge: Good  Language: Good  Assets:  Desire for Improvement Financial Resources/Insurance Housing Resilience  ADL's:  Intact  Cognition: WNL  Prognosis:  Good   GeneSight test results are on chart under scan.   DIAGNOSES:    ICD-10-CM   1. Malaise and fatigue  R53.81    R53.83     2. Obstructive sleep apnea  G47.33     3. Irritability and anger  R45.4     4. Bipolar I disorder, most recent episode depressed (HCC)  F31.30        Receiving Psychotherapy: No     RECOMMENDATIONS:  PDMP reviewed.  Last Klonopin filled 12/26/2020.  Lunesta 12/07/2020 per PCP I provided 40 minutes of face to face time during this encounter, including time spent before and after the visit in records review, medical decision making, and charting.  Options for treatment were discussed.  I recommend a new drug called Sunosi.  Works well for [employer] and motivation in someone who has sleep apnea.  Benefits, risks, and side effects were discussed.  She would like to try it.  Sample pack 75 mg was given.  She  will take half pill every morning for 1 week and then increase to 1 pill every morning thereafter.  She understands the maximum dose is 150 mg and if the medication is working well then we will stay with the same dose.  If working but not as good as she would like then we will increase to 150 mg.  A prescription for 150 mg has been sent in.  She can either split the dose or if we need to go up then she will take 1 whole pill. Start Sunosi as noted above. D/C Latuda which she has already done. Continue Klonopin 0.5 mg 1 p.o. twice daily as needed. Continue Cymbalta 20 mg, 2 p.o. daily.   Continue Lunesta 3 mg nightly as needed sleep. Return in 2 months.  Melony Overly, PA-C

## 2021-02-26 ENCOUNTER — Ambulatory Visit (INDEPENDENT_AMBULATORY_CARE_PROVIDER_SITE_OTHER): Admitting: Internal Medicine

## 2021-02-26 ENCOUNTER — Other Ambulatory Visit: Payer: Self-pay

## 2021-02-26 ENCOUNTER — Encounter: Payer: Self-pay | Admitting: Internal Medicine

## 2021-02-26 VITALS — BP 142/78 | HR 90 | Ht 62.5 in | Wt 188.0 lb

## 2021-02-26 DIAGNOSIS — E039 Hypothyroidism, unspecified: Secondary | ICD-10-CM | POA: Diagnosis not present

## 2021-02-26 LAB — BASIC METABOLIC PANEL
BUN: 17 mg/dL (ref 6–23)
CO2: 28 mEq/L (ref 19–32)
Calcium: 8.9 mg/dL (ref 8.4–10.5)
Chloride: 102 mEq/L (ref 96–112)
Creatinine, Ser: 0.65 mg/dL (ref 0.40–1.20)
GFR: 95.09 mL/min (ref >60.00)
Glucose, Bld: 108 mg/dL — ABNORMAL HIGH (ref 70–99)
Potassium: 4.1 mEq/L (ref 3.5–5.1)
Sodium: 138 mEq/L (ref 135–145)

## 2021-02-26 LAB — TSH: TSH: 1.62 u[IU]/mL (ref 0.35–5.50)

## 2021-02-26 NOTE — Progress Notes (Signed)
Name: Emma [name]  MRN/ DOB: [id number], 10/16/1960    Age/ Sex: 60 y.o., female     PCP: Emma Able, MD   Reason for Endocrinology Evaluation: Hypothyroidism     Initial Endocrinology Clinic Visit: 03/13/2020    PATIENT IDENTIFIER: Emma [name] is a 60 y.o., female with a past medical history of HTN, depression and anxiety. She has followed with [facility] since 03/13/2020 for consultative assistance with management of her Hypothyroidism.   HISTORICAL SUMMARY:  Pt presented to her PCP with c/o hair loss, dry skin and fatigue in 11/2019 . TSH was normal at the time ( she was on Biotin). Repeat TSH at our office in 02/2020 was noted to have a TSH of 7.49 uIU/mL and was started on LT-4 replacement at the time.    Sister with Graves' Disease  SUBJECTIVE:    Today (02/26/2021):  Emma [name] is here for a follow up on hypothyroidism .   Weight has been increasing    She is having heat intolerance and attributes this to levothyroxine  Sees a therapist for depression  Sleeps good at night  Takes stool softeners for constipation    Has been having eye issues, was seen by 2 eye doctors was told she has dry eyes. Uses artifical tear drops   No local neck symptoms    She is on Levothyroxine 50 mcg daily   Not on Biotin    HISTORY:  Past Medical History:  Past Medical History:  Diagnosis Date  . Anxiety   . Asthma   . GERD (gastroesophageal reflux disease)   . Hypertension   . PONV (postoperative nausea and vomiting)   . Sleep apnea    non compliant with CPAP  . Thyroid disease    Past Surgical History:  Past Surgical History:  Procedure Laterality Date  . ABDOMINAL HYSTERECTOMY    . CHOLECYSTECTOMY    . JOINT REPLACEMENT    . SHOULDER ARTHROSCOPY WITH SUBACROMIAL DECOMPRESSION, ROTATOR CUFF REPAIR AND BICEP TENDON REPAIR Left 04/01/2019   Procedure: LEFT SHOULDER ARTHROSCOPY WITH SUBACROMIAL DECOMPRESSION, DISTAL CLAVICLE EXCISION, BICEP TENODESIS,  ROTATOR CUFF REPAIR, spinal needle decrompression of glenoid cyst;  Surgeon: Sheral Apley, MD;  Location: [facility];  Service: Orthopedics;  Laterality: Left;   Social History:  reports that she quit smoking about 11 years ago. Her smoking use included cigarettes. She has never used smokeless tobacco. She reports current alcohol use. She reports that she does not use drugs. Family History:  Family History  Problem Relation Age of Onset  . Breast cancer Mother 11  . Dementia Mother   . Drug abuse Mother   . Breast cancer Sister 31  . Stomach cancer Father   . Heart disease Father   . Obesity Brother   . Breast cancer Paternal Grandmother      HOME MEDICATIONS: Allergies as of 02/26/2021       Reactions   Oxcarbazepine Nausea Only   Codeine Nausea Only        Medication List        Accurate as of February 26, 2021 10:31 AM. If you have any questions, ask your nurse or doctor.          cetirizine 10 MG tablet Commonly known as: ZYRTEC Take 10 mg by mouth daily.   clonazePAM 0.5 MG tablet Commonly known as: KLONOPIN TAKE 1 TABLET(0.5 MG) BY MOUTH TWICE DAILY AS NEEDED FOR ANXIETY   DULoxetine 20 MG capsule Commonly  known as: CYMBALTA Take 40 mg by mouth daily.   estradiol 2 MG tablet Commonly known as: ESTRACE Take 2 mg by mouth daily.   Eszopiclone 3 MG Tabs Take 3 mg by mouth at bedtime as needed for sleep. Take immediately before bedtime   Fish Oil 1000 MG Caps Take by mouth.   fluticasone 50 MCG/ACT nasal spray Commonly known as: FLONASE   levothyroxine 50 MCG tablet Commonly known as: SYNTHROID Take 1 tablet (50 mcg total) by mouth daily before breakfast.   losartan-hydrochlorothiazide 50-12.5 MG tablet Commonly known as: HYZAAR Take 1 tablet by mouth daily.   meloxicam 15 MG tablet Commonly known as: MOBIC Take 15 mg by mouth daily.   omeprazole 40 MG capsule Commonly known as: PRILOSEC Take 40 mg by mouth daily.    PROBIOTIC-10 PO Take by mouth.   Sunosi 150 MG Tabs Generic drug: Solriamfetol HCl Take 150 mg by mouth every morning.   UNABLE TO FIND CPAP   Vitamin D (Ergocalciferol) 1.25 MG (50000 UNIT) Caps capsule Commonly known as: DRISDOL Take 50,000 Units by mouth every 7 (seven) days.   vitamin E 1000 UNIT capsule Take 1,000 Units by mouth daily.          OBJECTIVE:   PHYSICAL EXAM: VS: BP (!) 142/78   Pulse 90   Ht 5' 2.5" (1.588 m)   Wt 188 lb (85.3 kg)   SpO2 98%   BMI 33.84 kg/m    EXAM: General: Pt appears well and is in NAD  Neck: General: Supple without adenopathy. Thyroid: Thyroid size normal.  No goiter or nodules appreciated.  Lungs: Clear with good BS bilat with no rales, rhonchi, or wheezes  Heart: Auscultation: RRR.  Mental Status: Judgment, insight: Intact Orientation: Oriented to time, place, and person Mood and affect: No depression, anxiety, or agitation     DATA REVIEWED:  Results for Emma, [name] (MRN [id number]) as of 02/27/2021 12:23  Ref. Range 02/26/2021 10:54  Sodium Latest Ref Range: 135 - 145 mEq/L 138  Potassium Latest Ref Range: 3.5 - 5.1 mEq/L 4.1  Chloride Latest Ref Range: 96 - 112 mEq/L 102  CO2 Latest Ref Range: 19 - 32 mEq/L 28  Glucose Latest Ref Range: 70 - 99 mg/dL 732 (H)  BUN Latest Ref Range: 6 - 23 mg/dL 17  Creatinine Latest Ref Range: 0.40 - 1.20 mg/dL 2.02  Calcium Latest Ref Range: 8.4 - 10.5 mg/dL 8.9  GFR Latest Ref Range: >60.00 mL/min 95.09  TSH Latest Ref Range: 0.35 - 5.50 uIU/mL 1.62     12/22/2019 TSH 3.22 uiu/mL Total t4 12.7 ( 5.14-11.9)   ASSESSMENT / PLAN / RECOMMENDATIONS:   Hypothyroidism:   -Patient with weight gain as well as heat intolerance, and tells me that she will stop the levothyroxine because she does not feel that it is helping her. -I have explained to the patient that levothyroxine is not a weight loss medication and that based on her normal TSH she is on the appropriate dose, I  explained to the patient that I will be happy to refer her to the weight loss clinic if this is a concern.  I also explained to the patient that if she stops levothyroxine she is at risk for myxedema coma, as well as more weight gain and could have more symptoms of fatigue and constipation. - Pt educated extensively on the correct way to take levothyroxine (first thing in the morning with water, 30 minutes before eating or taking other medications). -  Pt encouraged to double dose the following day if she were to miss a dose given long half-life of levothyroxine. - TSH is normal , no changes     Medications   Continue Levothyroxine 50 mcg daily     2.  Weight gain:  -I have offered the patient a referral to weight loss clinic, but she is going to [place] next month and she typically loses weight, patient states that if she is unable to lose the weight she will try weight watchers or any other weight loss program prior to considering a referral at this time.    F/U in 4 months    Signed electronically by: Lyndle Herrlich, MD  [facility]  [facility] [address] [place], [place] [address] Phone: [phone] FAX: [phone]      CC: Emma Able, MD [address] [place] [address] Phone: [phone]  Fax: [phone]   Return to Endocrinology clinic as below: Future Appointments  Date Time Provider Department Center  04/22/2021 11:00 AM Melony Overly T, PA-C CP-CP None

## 2021-02-27 ENCOUNTER — Telehealth: Payer: Self-pay | Admitting: Physician Assistant

## 2021-02-27 NOTE — Telephone Encounter (Signed)
Emma [name] called because apparently her insurance is needing a PA for the [facility].  She is getting ready to go out of town, what should she do about having medication while she waits on the PA?  Please call to advise what to do

## 2021-02-27 NOTE — Telephone Encounter (Signed)
Emma [name] can you check for samples? That's really the only thing we can check

## 2021-02-27 NOTE — Telephone Encounter (Signed)
Please review

## 2021-02-27 NOTE — Telephone Encounter (Signed)
LVM informing pt to pick up samples

## 2021-03-01 NOTE — Telephone Encounter (Signed)
Her Prior Authorization was DENIED for [facility],  because she has not tried other medications such as modafinil or armodafinil. Those both are on formulary.   Please advise when able.

## 2021-03-04 NOTE — Telephone Encounter (Signed)
Will send in Modafanil. Has she already left on her trip? She plans to be gone a month. I can send Rx wherever she wants.

## 2021-03-08 NOTE — Telephone Encounter (Signed)
Emma [name] can you give her a call about her denial. She should already know but she was going out of town.

## 2021-03-08 NOTE — Telephone Encounter (Signed)
ok 

## 2021-03-08 NOTE — Telephone Encounter (Signed)
Pt has the samples that I gave her and they will last until she returns.She will give [place] a call then

## 2021-04-04 IMAGING — MG DIGITAL DIAGNOSTIC BILAT W/ TOMO W/ CAD
8 series · 8 of 24 positions shown · non-contrast
Comparison: Previous studies from [facility] [REDACTED].

CLINICAL DATA: 59-year-old female for delayed follow-up of RIGHT
breast mass/asymmetry from outside institution.Also for annual
bilateral mammogram.

EXAM:
DIGITAL DIAGNOSTIC BILATERAL MAMMOGRAM WITH CAD AND TOMO
ULTRASOUND RIGHT BREAST

[L CC synth-2D]
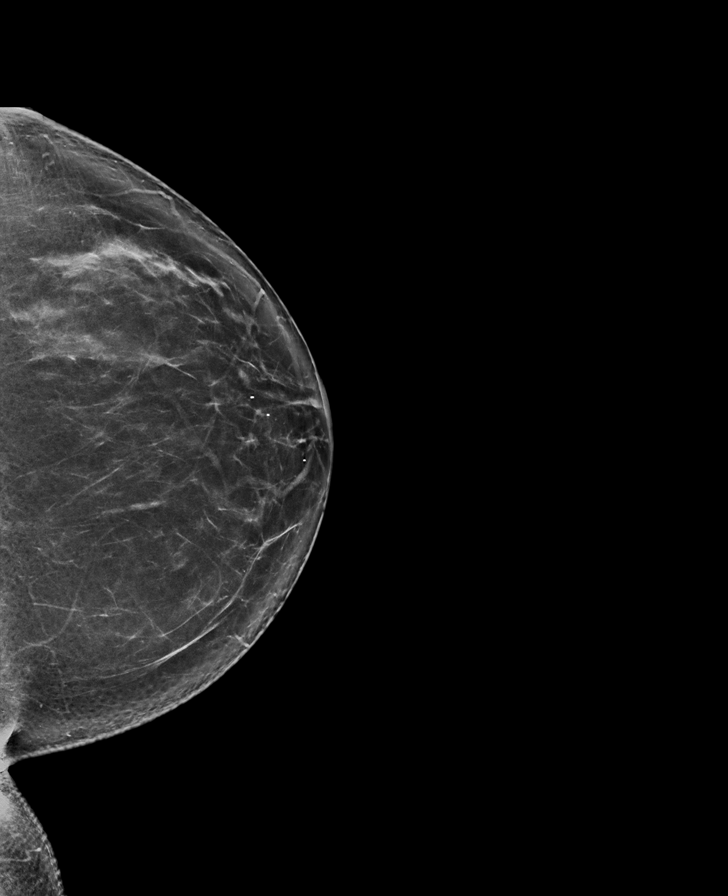

[R CC synth-2D]
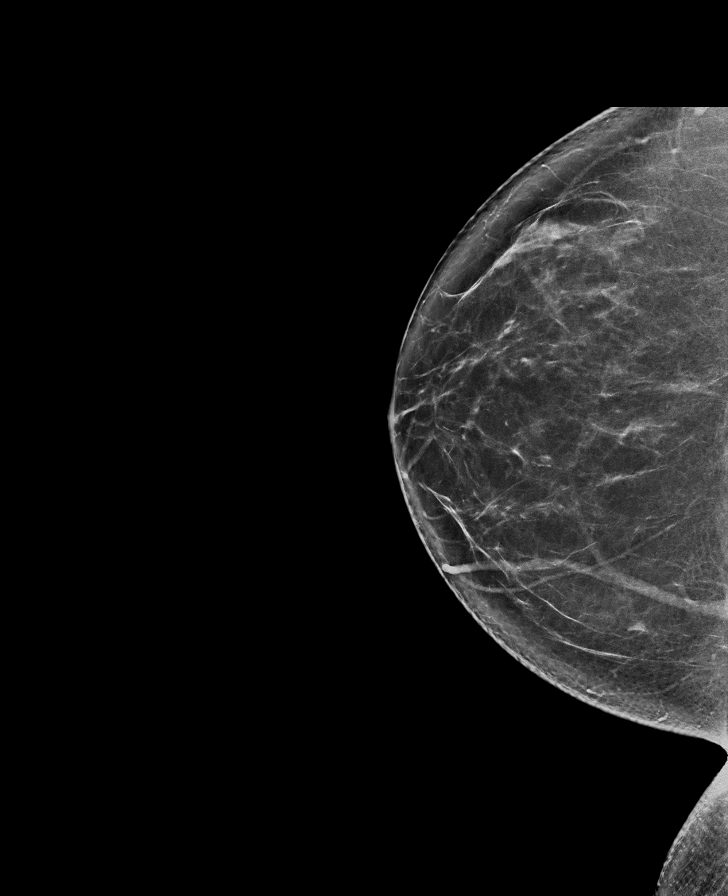

[R MLO synth-2D]
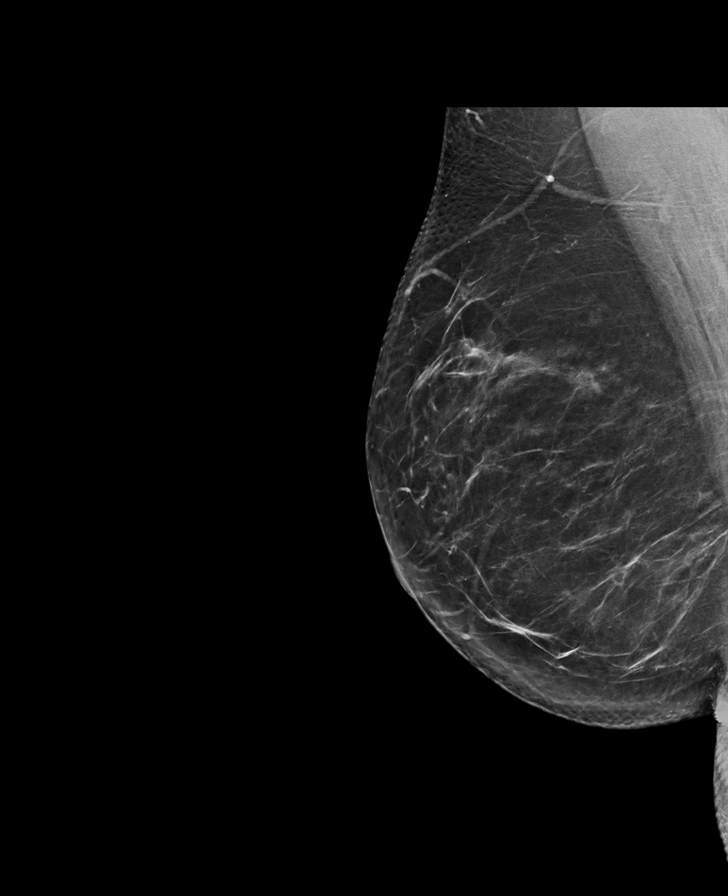

[L MLO synth-2D]
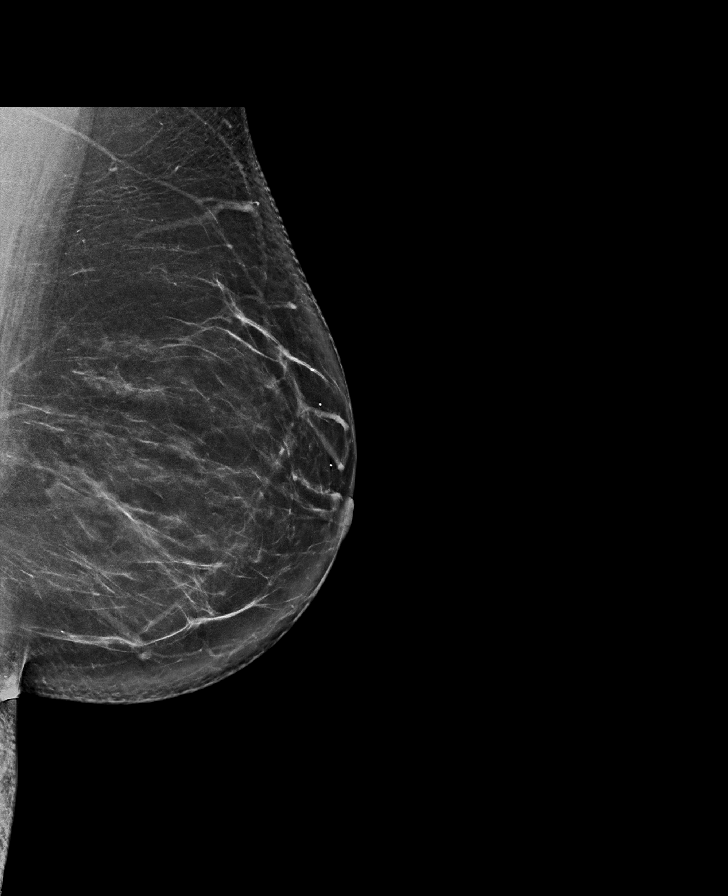

[R MLO tomo · tomo slice 47/92.0]
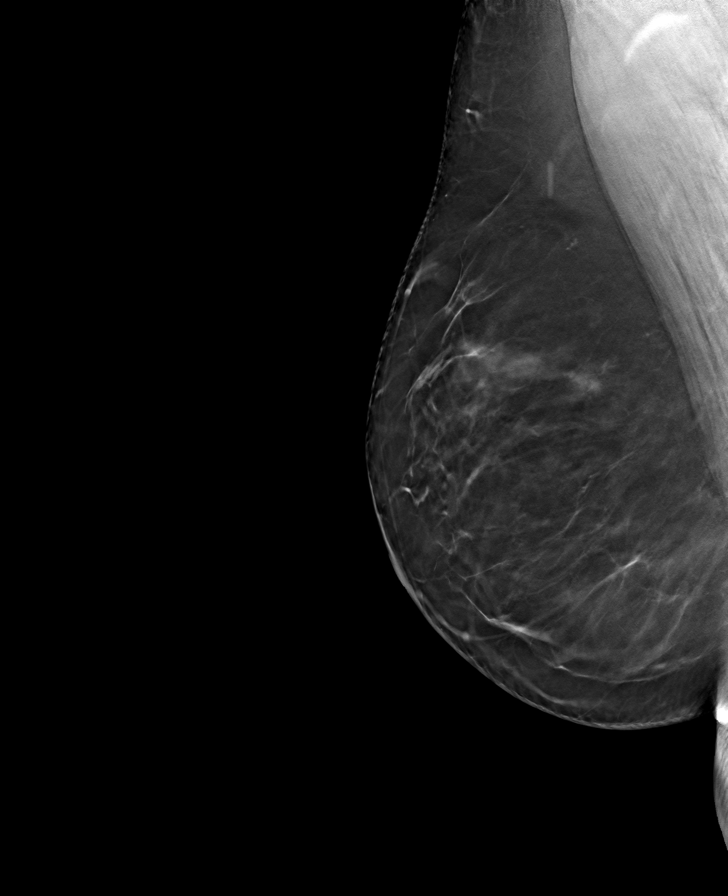

[R CC tomo · tomo slice 45/90.0]
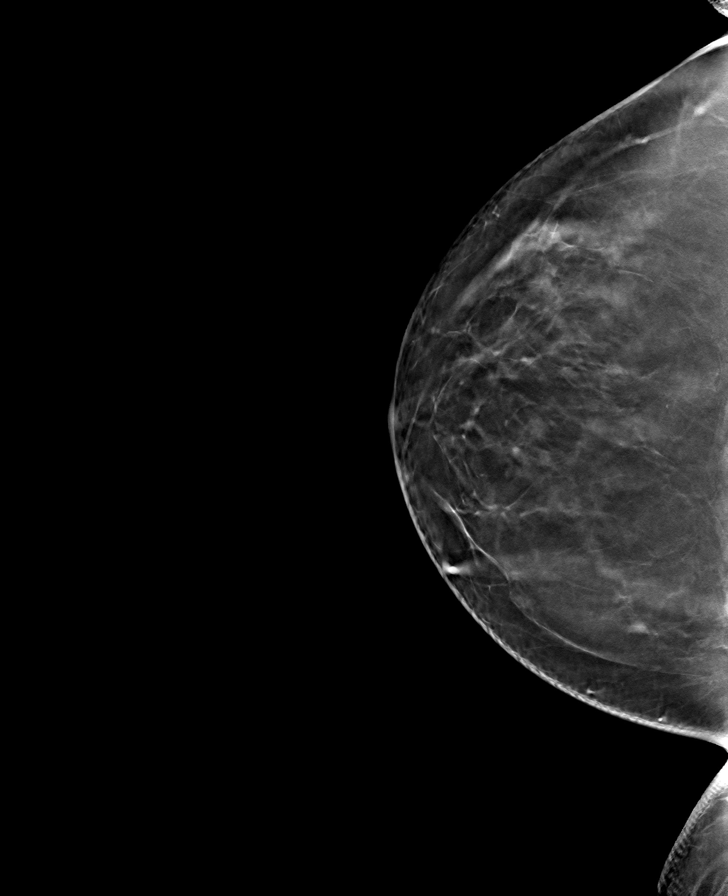

[L MLO tomo · tomo slice 47/92.0]
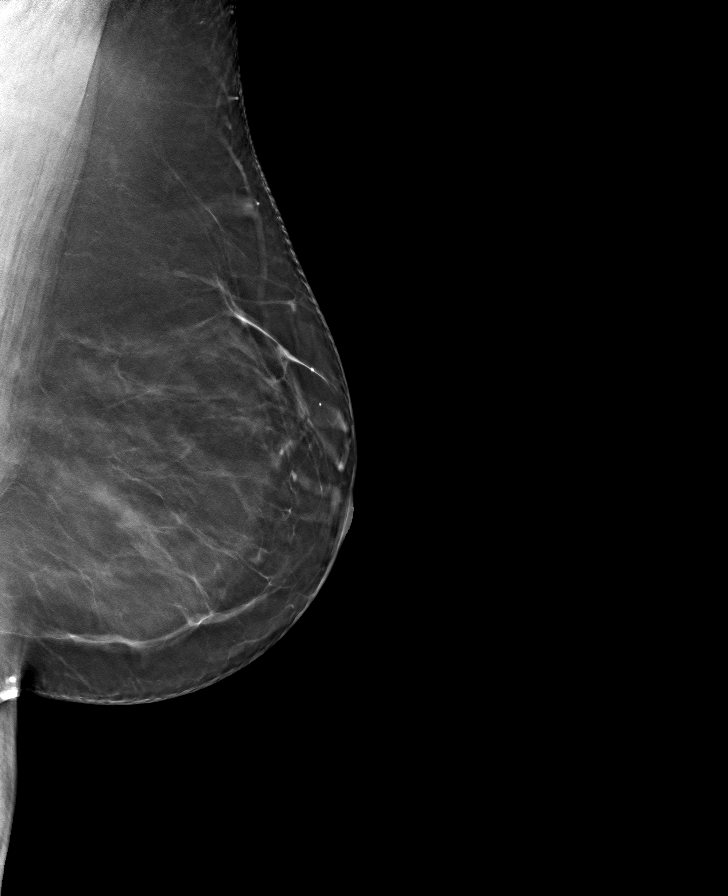

[L CC tomo · tomo slice 45/88.0]
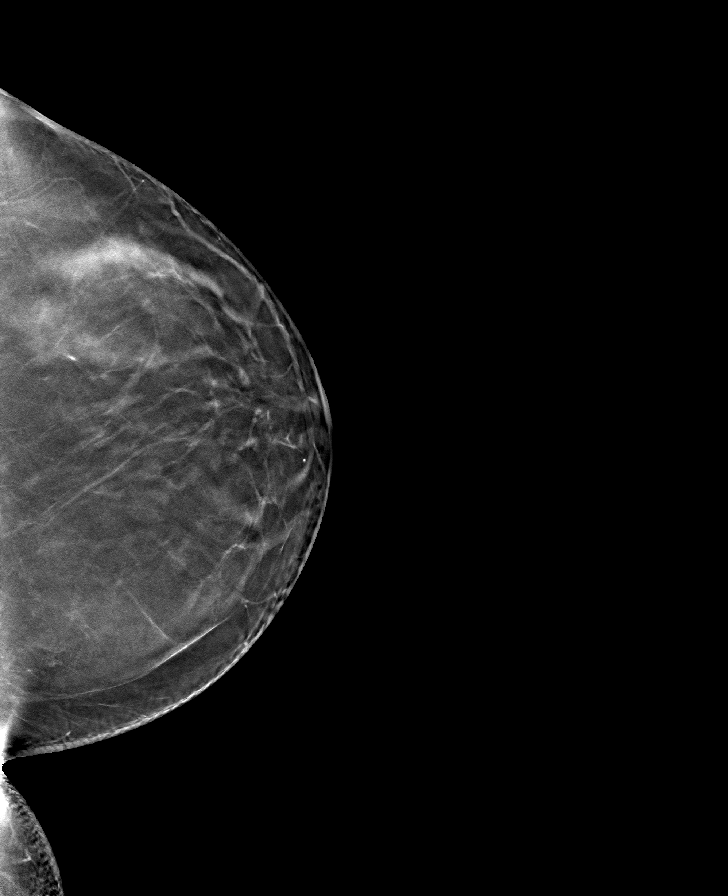

[8 of 24 positions shown; findings below may reference images not displayed]

These include
07/11/2019 ultrasound and mammogram and prior mammograms.

ACR Breast Density Category b: There are scattered areas of
fibroglandular density.
FINDINGS: 2D/3D full field views of both breasts demonstrate no suspicious
mass, distortion or worrisome calcifications.

Mammographic images were processed with CAD.

Targeted ultrasound is performed, showing a stable 0.5 x 0.2 x
cm complicated cyst at the 9 o'clock position of the RIGHT breast 6
cm from the nipple.
IMPRESSION: 1. Stable benign complicated cyst within the OUTER RIGHT breast.
2. No mammographic evidence of breast malignancy.

RECOMMENDATION:
Bilateral screening mammogram in 1 year.

I have discussed the findings and recommendations with the patient.
If applicable, a reminder letter will be sent to the patient
regarding the next appointment.

BI-RADS CATEGORY  2: Benign.

## 2021-04-22 ENCOUNTER — Encounter: Payer: Self-pay | Admitting: Physician Assistant

## 2021-04-22 ENCOUNTER — Ambulatory Visit (INDEPENDENT_AMBULATORY_CARE_PROVIDER_SITE_OTHER): Admitting: Physician Assistant

## 2021-04-22 ENCOUNTER — Other Ambulatory Visit: Payer: Self-pay

## 2021-04-22 DIAGNOSIS — G4733 Obstructive sleep apnea (adult) (pediatric): Secondary | ICD-10-CM | POA: Diagnosis not present

## 2021-04-22 DIAGNOSIS — R5381 Other malaise: Secondary | ICD-10-CM

## 2021-04-22 DIAGNOSIS — R5383 Other fatigue: Secondary | ICD-10-CM

## 2021-04-22 DIAGNOSIS — F319 Bipolar disorder, unspecified: Secondary | ICD-10-CM

## 2021-04-22 DIAGNOSIS — F4321 Adjustment disorder with depressed mood: Secondary | ICD-10-CM

## 2021-04-22 DIAGNOSIS — F411 Generalized anxiety disorder: Secondary | ICD-10-CM | POA: Diagnosis not present

## 2021-04-22 MED ORDER — MODAFINIL 200 MG PO TABS: 100.0000 mg | ORAL_TABLET | Freq: Every day | ORAL | 1 refills | Status: DC

## 2021-04-22 MED ORDER — ESZOPICLONE 3 MG PO TABS: 3.0000 mg | ORAL_TABLET | Freq: Every evening | ORAL | 5 refills | Status: DC | PRN

## 2021-04-22 NOTE — Progress Notes (Signed)
[facility]  Patient ID: Emma [name],  MRN: [id number]  PCP: Leilani Able, MD  Date of Evaluation: 04/22/2021 Time spent:40 minutes  Chief Complaint:  Chief Complaint   Follow-up; Anxiety; Depression; Insomnia      HISTORY/CURRENT STATUS: HPI  For routine med check.  Grieving her sister who died only a few days after she was put on hospice.  She had a mass on her lung but would not agree to a biopsy, so they can just assume that she died from lung cancer.  Then not long after that one of her uncles died.  At her last visit she told me that her niece had died suddenly.    As far as her mood goes, seems to be doing well.  She is aggravated at the situation in her family with a nephew's response to her sister's death.  But she feels that the current medications are helping her cope with all that better than she would if she was not on the medication.  She has not really done much of anything because she has been in [place] a lot of the time with these things going on in her family.  She is sleeping well, uses her CPAP every night.  No suicidal or homicidal thoughts.  At the last visit I prescribed Sunosi, for the lack of motivation and energy associated with sleep apnea.  Samples were given and patient states it has helped her tremendously.  However her insurance will not pay for it, so we need to come up with another plan.  Patient denies increased energy with decreased need for sleep, no increased talkativeness, no racing thoughts, no impulsivity or risky behaviors, no increased spending, no increased libido, no grandiosity, no increased irritability or anger, and no hallucinations.  She does continue to have anxiety, mostly generalized.  She does not take the Klonopin on a daily basis but it is helpful when she needs it.  Review of Systems  Constitutional:  Positive for malaise/fatigue.       See HPI  HENT: Negative.    Eyes: Negative.   Respiratory: Negative.     Cardiovascular: Negative.   Gastrointestinal: Negative.   Genitourinary: Negative.   Musculoskeletal: Negative.   Skin: Negative.   Neurological: Negative.   Endo/Heme/Allergies: Negative.   Psychiatric/Behavioral:         See HPI   Individual Medical History/ Review of Systems: Changes? 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 , Disp: , Rfl:    estradiol (ESTRACE) 2 MG tablet, Take 2 mg by mouth daily. , Disp: , Rfl:    fluticasone (FLONASE) 50 MCG/ACT nasal spray, , Disp: , Rfl:    levothyroxine (SYNTHROID) 50 MCG tablet, Take 1 tablet (50 mcg total) by mouth daily before breakfast., Disp: 90 tablet, Rfl: 3   losartan-hydrochlorothiazide (HYZAAR) 50-12.5  MG tablet, Take 1 tablet by mouth daily., Disp: , Rfl:    meloxicam (MOBIC) 15 MG tablet, Take 15 mg by mouth daily., Disp: , Rfl:    modafinil (PROVIGIL) 200 MG tablet, Take 0.5 tablets (100 mg total) by mouth daily., Disp: 30 tablet, Rfl: 1   Omega-3 Fatty Acids (FISH OIL) 1000 MG CAPS, Take by mouth., Disp: , Rfl:    omeprazole (PRILOSEC) 40 MG capsule, Take 40 mg by mouth daily. , Disp: , Rfl:    Probiotic Product (PROBIOTIC-10 PO), Take by mouth., Disp: , Rfl:    UNABLE TO FIND, CPAP, Disp: , Rfl:    Vitamin D, Ergocalciferol, (DRISDOL)  50000 units CAPS capsule, Take 50,000 Units by mouth every 7 (seven) days., Disp: , Rfl:    vitamin E 1000 UNIT capsule, Take 1,000 Units by mouth daily., Disp: , Rfl:    Eszopiclone 3 MG TABS, Take 1 tablet (3 mg total) by mouth at bedtime as needed., Disp: 30 tablet, Rfl: 5 Medication Side Effects: none  Family Medical/ Social History: Changes?  Sister died from Lung cancer since LOV. Uncle died, since LOV.  Niece died a few months ago, suddenly from MI. It's been a rough few months.    MENTAL HEALTH EXAM:  There were no vitals taken for this visit.There is no height or weight on file to calculate BMI.  General Appearance: Casual, Fairly Groomed, and Obese  Eye Contact:  Good  Speech:  Clear and Coherent and Normal Rate  Volume:  Normal  Mood:  Euthymic  Affect:  Congruent  Thought Process:  Goal Directed  Orientation:  Full (Time, Place, and Person)  Thought Content: Logical   Suicidal Thoughts:  No  Homicidal Thoughts:  No  Memory:  WNL  Judgement:  Good  Insight:  Good  Psychomotor Activity:  Normal  Concentration:  Concentration: Good and Attention Span: Good  Recall:  Good  Fund of Knowledge: Good  Language: Good  Assets:  Desire for Improvement Financial Resources/Insurance Housing Resilience  ADL's:  Intact  Cognition: WNL  Prognosis:  Good   GeneSight test results are on chart under media/scan.   DIAGNOSES:    ICD-10-CM   1. Malaise and fatigue  R53.81    R53.83     2. Obstructive sleep apnea  G47.33     3. Bipolar I disorder (HCC)  F31.9     4. Generalized anxiety disorder  F41.1     5. Grief  F43.21         Receiving Psychotherapy: No    RECOMMENDATIONS:  PDMP reviewed.  Last Klonopin was filled 04/15/2021.  Last Lunesta filled 02/21/2021. I provided 40 minutes of face to face time during this encounter, including time spent before and after the visit in records review, medical decision making, counseling pertinent to this office visit and  treatment options, and charting.  My condolences for the loss of her sister and uncle, as well as her niece who passed away several months back. I am glad to hear that the Sunosi has helped with energy and motivation.  We will have to jump through insurance hoops to get them to pay for it most likely.  Other options would be modafinil or armodafinil.  1 of those may work just as well or even better than the Sunosi and we may not need to go that route.  We discussed the benefits, risks, and side effects of modafinil and armodafinil and she accepts.  We discussed the fact that  most insurances will not pay for modafinil or armodafinil because it is only approved for narcolepsy.  GoodRx shows [facility] as the cheapest out of pocket cost today so I sent that prescription there. Start modafinil 200 mg, 1/2 pill every morning. Continue Klonopin 0.5 mg 1 p.o. twice daily as needed. Continue Cymbalta 20 mg, 2 p.o. daily.   Continue Lunesta 3 mg nightly as needed sleep. Continue vitamin D 50,000 units weekly. Continue CPAP use. Return in 6 weeks.  Melony Overly, PA-C

## 2021-06-07 ENCOUNTER — Other Ambulatory Visit: Payer: Self-pay

## 2021-06-07 ENCOUNTER — Encounter: Payer: Self-pay | Admitting: Physician Assistant

## 2021-06-07 ENCOUNTER — Ambulatory Visit (INDEPENDENT_AMBULATORY_CARE_PROVIDER_SITE_OTHER): Admitting: Physician Assistant

## 2021-06-07 DIAGNOSIS — R454 Irritability and anger: Secondary | ICD-10-CM | POA: Diagnosis not present

## 2021-06-07 DIAGNOSIS — F411 Generalized anxiety disorder: Secondary | ICD-10-CM | POA: Diagnosis not present

## 2021-06-07 DIAGNOSIS — R5381 Other malaise: Secondary | ICD-10-CM

## 2021-06-07 DIAGNOSIS — F5105 Insomnia due to other mental disorder: Secondary | ICD-10-CM | POA: Diagnosis not present

## 2021-06-07 DIAGNOSIS — F99 Mental disorder, not otherwise specified: Secondary | ICD-10-CM

## 2021-06-07 DIAGNOSIS — R5383 Other fatigue: Secondary | ICD-10-CM

## 2021-06-07 DIAGNOSIS — G4733 Obstructive sleep apnea (adult) (pediatric): Secondary | ICD-10-CM

## 2021-06-07 MED ORDER — CLONAZEPAM 0.5 MG PO TABS: ORAL_TABLET | ORAL | 5 refills | Status: DC

## 2021-06-07 NOTE — Progress Notes (Signed)
[facility]  Patient ID: Emma [name],  MRN: [id number]  PCP: Leilani Able, MD  Date of Evaluation: 06/07/2021 Time spent:30 minutes  Chief Complaint:  Chief Complaint   Anxiety; Depression; Fatigue; Follow-up     HISTORY/CURRENT STATUS: HPI  For routine med check.  Klonopin is really helpful when she starts getting aggravated and gets "bitchy. It helps me calm down."   Has had a few more deaths in her family/friends since our LOV 6 weeks ago, her ex-sister in law died, also a cousin who she was close to, and a friend in [place].   Patient denies loss of interest in usual activities and is able to enjoy things.  Denies decreased energy or motivation.  Appetite has not changed. Is on Ozempic and is losing weight. No extreme sadness, tearfulness, or feelings of hopelessness.  Denies any changes in concentration, making decisions or remembering things.  Sleeps well most of the time, using Lunesta and her CPAP.  Denies suicidal or homicidal thoughts.  Hasn't started the Modafinil. Wasn't sleeping and was nervous about taking it. But she's had 6 deaths in her family/friend group in the past 6 months or so. Does need more energy and motivation.  Patient denies increased energy with decreased need for sleep, no increased talkativeness, no racing thoughts, no impulsivity or risky behaviors, no increased spending, no increased libido, no grandiosity, no increased irritability or anger, and no hallucinations.  Denies dizziness, syncope, seizures, numbness, tingling, tremor, tics, unsteady gait, slurred speech, confusion. Denies muscle or joint pain, stiffness, or dystonia.   Individual Medical History/ Review of Systems: Changes? :No   Past medications for mental health diagnoses include: Cymbalta causes sweating/hot flashes above 40 mg, Prozac worked for [employer], Effexor caused her to be a zombie, Wellbutrin caused anger, Klonopin, Lunesta, Zoloft, Ambien, Trazodone, Trileptal caused  severe nausea, Lamictal caused insomnia, Latuda caused h/a, had 'body heat not like hot flashes from menopause.' Sunosi too expensive.  Allergies: Oxcarbazepine and Codeine  Current Medications:  Current Outpatient Medications:    cetirizine (ZYRTEC) 10 MG tablet, Take 10 mg by mouth daily., Disp: , Rfl:    DULoxetine (CYMBALTA) 20 MG capsule, Take 40 mg by mouth daily. , Disp: , Rfl:    estradiol (ESTRACE) 2 MG tablet, Take 2 mg by mouth daily. , Disp: , Rfl:    fluticasone (FLONASE) 50 MCG/ACT nasal spray, , Disp: , Rfl:    levothyroxine (SYNTHROID) 50 MCG tablet, Take 1 tablet (50 mcg total) by mouth daily before breakfast., Disp: 90 tablet, Rfl: 3   meloxicam (MOBIC) 15 MG tablet, Take 15 mg by mouth daily., Disp: , Rfl:    omeprazole (PRILOSEC) 40 MG capsule, Take 40 mg by mouth daily. , Disp: , Rfl:    Probiotic Product (PROBIOTIC-10 PO), Take by mouth., Disp: , Rfl:    UNABLE TO FIND, CPAP, Disp: , Rfl:    Vitamin D, Ergocalciferol, (DRISDOL) 50000 units CAPS capsule, Take 50,000 Units by mouth every 7 (seven) days., Disp: , Rfl:    vitamin E 1000 UNIT capsule, Take 1,000 Units by mouth daily., Disp: , Rfl:    clonazePAM (KLONOPIN) 0.5 MG tablet, TAKE 1 TABLET(0.5 MG) BY MOUTH TWICE DAILY AS NEEDED FOR ANXIETY, Disp: 30 tablet, Rfl: 5   Eszopiclone 3 MG TABS, Take 1 tablet (3 mg total) by mouth at bedtime as needed. (Patient not taking: Reported on 06/07/2021), Disp: 30 tablet, Rfl: 5   losartan-hydrochlorothiazide (HYZAAR) 50-12.5 MG tablet, Take 1 tablet by mouth daily. (  Patient not taking: Reported on 06/07/2021), Disp: , Rfl:    modafinil (PROVIGIL) 200 MG tablet, Take 0.5 tablets (100 mg total) by mouth daily. (Patient not taking: Reported on 06/07/2021), Disp: 30 tablet, Rfl: 1   Omega-3 Fatty Acids (FISH OIL) 1000 MG CAPS, Take by mouth. (Patient not taking: Reported on 06/07/2021), Disp: , Rfl:  Medication Side Effects: none  Family Medical/ Social History: Changes?  See  HPI.   MENTAL HEALTH EXAM:  There were no vitals taken for this visit.There is no height or weight on file to calculate BMI.  General Appearance: Casual, Well Groomed, and Obese  Eye Contact:  Good  Speech:  Clear and Coherent and Normal Rate  Volume:  Normal  Mood:  Euthymic  Affect:  Congruent  Thought Process:  Goal Directed  Orientation:  Full (Time, Place, and Person)  Thought Content: Logical   Suicidal Thoughts:  No  Homicidal Thoughts:  No  Memory:  WNL  Judgement:  Good  Insight:  Good  Psychomotor Activity:  Normal  Concentration:  Concentration: Good and Attention Span: Good  Recall:  Good  Fund of Knowledge: Good  Language: Good  Assets:  Desire for Improvement Financial Resources/Insurance Housing Resilience  ADL's:  Intact  Cognition: WNL  Prognosis:  Good   GeneSight test results are on chart under media/scan.  02/26/2021 TSH and BNP were reviewed.   DIAGNOSES:    ICD-10-CM   1. Generalized anxiety disorder  F41.1     2. Irritability and anger  R45.4     3. Insomnia due to other mental disorder  F51.05    F99     4. Malaise and fatigue  R53.81    R53.83     5. Obstructive sleep apnea  G47.33          Receiving Psychotherapy: No    RECOMMENDATIONS:  PDMP reviewed.  Last Klonopin filled 05/20/2021. I provided 30 minutes of face to face time during this encounter, including time spent before and after the visit in records review, medical decision making, Counseling pertinent to today's visit, and charting.  We again discussed modafinil.  I would like for her to try it. Start modafinil 200 mg, 1/2 pill every morning. Continue Klonopin 0.5 mg 1 p.o. twice daily as needed. Continue Cymbalta 20 mg, 2 p.o. daily.' Continue Lunesta 3 mg nightly as needed sleep. Continue vitamin D 50,000 units weekly. Continue CPAP use. Return in 6 weeks.  Melony Overly, PA-C

## 2021-07-24 ENCOUNTER — Ambulatory Visit: Admitting: Physician Assistant

## 2021-09-03 ENCOUNTER — Encounter: Payer: Self-pay | Admitting: Internal Medicine

## 2021-09-03 ENCOUNTER — Ambulatory Visit (INDEPENDENT_AMBULATORY_CARE_PROVIDER_SITE_OTHER): Admitting: Physician Assistant

## 2021-09-03 ENCOUNTER — Encounter: Payer: Self-pay | Admitting: Physician Assistant

## 2021-09-03 ENCOUNTER — Other Ambulatory Visit: Payer: Self-pay

## 2021-09-03 ENCOUNTER — Ambulatory Visit (INDEPENDENT_AMBULATORY_CARE_PROVIDER_SITE_OTHER): Admitting: Internal Medicine

## 2021-09-03 VITALS — BP 168/90 | HR 90 | Ht 62.5 in | Wt 183.0 lb

## 2021-09-03 DIAGNOSIS — R5381 Other malaise: Secondary | ICD-10-CM

## 2021-09-03 DIAGNOSIS — G4733 Obstructive sleep apnea (adult) (pediatric): Secondary | ICD-10-CM

## 2021-09-03 DIAGNOSIS — F319 Bipolar disorder, unspecified: Secondary | ICD-10-CM | POA: Diagnosis not present

## 2021-09-03 DIAGNOSIS — F4321 Adjustment disorder with depressed mood: Secondary | ICD-10-CM

## 2021-09-03 DIAGNOSIS — I10 Essential (primary) hypertension: Secondary | ICD-10-CM | POA: Diagnosis not present

## 2021-09-03 DIAGNOSIS — E039 Hypothyroidism, unspecified: Secondary | ICD-10-CM

## 2021-09-03 DIAGNOSIS — F411 Generalized anxiety disorder: Secondary | ICD-10-CM | POA: Diagnosis not present

## 2021-09-03 DIAGNOSIS — F99 Mental disorder, not otherwise specified: Secondary | ICD-10-CM

## 2021-09-03 DIAGNOSIS — R5383 Other fatigue: Secondary | ICD-10-CM

## 2021-09-03 DIAGNOSIS — F5105 Insomnia due to other mental disorder: Secondary | ICD-10-CM

## 2021-09-03 LAB — TSH: TSH: 2.04 u[IU]/mL (ref 0.35–5.50)

## 2021-09-03 MED ORDER — LOSARTAN POTASSIUM-HCTZ 100-12.5 MG PO TABS: 1.0000 | ORAL_TABLET | Freq: Every day | ORAL | 0 refills | Status: DC

## 2021-09-03 MED ORDER — MODAFINIL 200 MG PO TABS: 100.0000 mg | ORAL_TABLET | Freq: Every day | ORAL | 3 refills | Status: DC

## 2021-09-03 MED ORDER — LEVOTHYROXINE SODIUM 50 MCG PO TABS: 50.0000 ug | ORAL_TABLET | Freq: Every day | ORAL | 3 refills | Status: AC

## 2021-09-03 NOTE — Patient Instructions (Signed)
Please start new Hyzaar dose 100-12.5 mg daily    Please follow up with your Primary care  on blood pressure

## 2021-09-03 NOTE — Progress Notes (Signed)
[facility]  Patient ID: Meladee Coppins,  MRN: [id number]  PCP: Lin Landsman, MD  Date of Evaluation: 09/03/2021 Time spent:20 minutes  Chief Complaint:  Chief Complaint   Anxiety; Depression; Follow-up     HISTORY/CURRENT STATUS: HPI  For routine med check.  Brother-in-law died since LOV. Makes 3 people in that family, see previous notes. She tries not to think about it.  Having bladder problems, has incontinence, is going through w/u and will be having a procedure soon, some sort of electrical treatment to take care the problem. Not sure when that will be. Her granddaughter is getting ready to have her baby. And her grandson will be having heart surgery soon, so under a lot of stress.   Started the modafanil, as needed. It's very helpful but unable to take it daily, she gets tired but does not get a good night's sleep on the nights she takes it.  She does have more energy and feels better on the days she takes it though so feels like it is a good trade-off.  She is able to enjoy things sometimes.  She has not been able to travel to see friends and family in [place] like she usually does, because of the incontinence.  She does not cry easily.  Appetite is normal and weight is stable.  She is not working.  No suicidal or homicidal thoughts.  Patient denies increased energy with decreased need for sleep, no increased talkativeness, no racing thoughts, no impulsivity or risky behaviors, no increased spending, no increased libido, no grandiosity, no increased irritability or anger, and no hallucinations.  Denies dizziness, syncope, seizures, numbness, tingling, tremor, tics, unsteady gait, slurred speech, confusion. Denies muscle or joint pain, stiffness, or dystonia.   Individual Medical History/ Review of Systems: Changes? :Yes see HPI  Past medications for mental health diagnoses include: Cymbalta causes sweating/hot flashes above 40 mg, Prozac worked for [employer], Effexor  caused her to be a zombie, Wellbutrin caused anger, Klonopin, Lunesta, Zoloft, Ambien, Trazodone, Trileptal caused severe nausea, Lamictal caused insomnia, Latuda caused h/a, had 'body heat not like hot flashes from menopause.' Sunosi too expensive.  Allergies: Oxcarbazepine and Codeine  Current Medications:  Current Outpatient Medications:    cetirizine (ZYRTEC) 10 MG tablet, Take 10 mg by mouth daily., Disp: , Rfl:    clonazePAM (KLONOPIN) 0.5 MG tablet, TAKE 1 TABLET(0.5 MG) BY MOUTH TWICE DAILY AS NEEDED FOR ANXIETY, Disp: 30 tablet, Rfl: 5   DULoxetine (CYMBALTA) 20 MG capsule, Take 40 mg by mouth daily. , Disp: , Rfl:    estradiol (ESTRACE) 2 MG tablet, Take 2 mg by mouth daily. , Disp: , Rfl:    fluticasone (FLONASE) 50 MCG/ACT nasal spray, , Disp: , Rfl:    levothyroxine (SYNTHROID) 50 MCG tablet, Take 1 tablet (50 mcg total) by mouth daily before breakfast., Disp: 90 tablet, Rfl: 3   losartan-hydrochlorothiazide (HYZAAR) 100-12.5 MG tablet, Take 1 tablet by mouth daily., Disp: 90 tablet, Rfl: 0   meloxicam (MOBIC) 15 MG tablet, Take 15 mg by mouth daily., Disp: , Rfl:    Omega-3 Fatty Acids (FISH OIL) 1000 MG CAPS, Take by mouth., Disp: , Rfl:    omeprazole (PRILOSEC) 40 MG capsule, Take 40 mg by mouth daily. , Disp: , Rfl:    Probiotic Product (PROBIOTIC-10 PO), Take by mouth., Disp: , Rfl:    UNABLE TO FIND, CPAP, Disp: , Rfl:    Vitamin D, Ergocalciferol, (DRISDOL) 50000 units CAPS capsule, Take 50,000 Units by mouth  every 7 (seven) days., Disp: , Rfl:    vitamin E 1000 UNIT capsule, Take 1,000 Units by mouth daily., Disp: , Rfl:    Eszopiclone 3 MG TABS, Take 1 tablet (3 mg total) by mouth at bedtime as needed. (Patient not taking: Reported on 09/03/2021), Disp: 30 tablet, Rfl: 5   modafinil (PROVIGIL) 200 MG tablet, Take 0.5 tablets (100 mg total) by mouth daily., Disp: 30 tablet, Rfl: 3 Medication Side Effects: none  Family Medical/ Social History: Changes?  See HPI.   MENTAL  HEALTH EXAM:  There were no vitals taken for this visit.There is no height or weight on file to calculate BMI.  General Appearance: Casual, Well Groomed, and Obese  Eye Contact:  Good  Speech:  Clear and Coherent and Normal Rate  Volume:  Normal  Mood:  Euthymic  Affect:  Congruent  Thought Process:  Goal Directed and Descriptions of Associations: Circumstantial  Orientation:  Full (Time, Place, and Person)  Thought Content: Logical   Suicidal Thoughts:  No  Homicidal Thoughts:  No  Memory:  WNL  Judgement:  Good  Insight:  Good  Psychomotor Activity:  Normal  Concentration:  Concentration: Good and Attention Span: Good  Recall:  Good  Fund of Knowledge: Good  Language: Good  Assets:  Desire for Improvement Financial Resources/Insurance Housing Resilience  ADL's:  Intact  Cognition: WNL  Prognosis:  Good   GeneSight test results are on chart under media/scan.  DIAGNOSES:    ICD-10-CM   1. Bipolar I disorder ([place])  F31.9     2. Generalized anxiety disorder  F41.1     3. Insomnia due to other mental disorder  F51.05    F99     4. Malaise and fatigue  R53.81    R53.83     5. Obstructive sleep apnea  G47.33     6. Grief  F43.21       Receiving Psychotherapy: No    RECOMMENDATIONS:  PDMP reviewed.  Last Klonopin 06/07/2021. I provided 20 minutes of face to face time during this encounter, including time spent before and after the visit in records review, medical decision making, counseling pertinent to today's visit, and charting.  She is doing well as far as medications are concerned.  No changes will be made. Continue Klonopin 0.5 mg 1 p.o. twice daily as needed. Continue Cymbalta 20 mg, 2 p.o. daily.' Continue Lunesta 3 mg nightly as needed sleep. Continue modafinil 200 mg, 1/2 pill every morning as needed. Continue vitamin D 50,000 units weekly. Continue CPAP use. Return in 3 months.Donnal Moat, PA-C

## 2021-09-03 NOTE — Progress Notes (Signed)
Name: Emma [name]  MRN/ DOB: [id number], 1961-08-07    Age/ Sex: 61 y.o., female     PCP: Leilani Able, MD   Reason for Endocrinology Evaluation: Hypothyroidism     Initial Endocrinology Clinic Visit: 03/13/2020    PATIENT IDENTIFIER: Ms. Emma [name] is a 61 y.o., female with a past medical history of HTN, depression and anxiety. She has followed with [facility] since 03/13/2020 for consultative assistance with management of her Hypothyroidism.   HISTORICAL SUMMARY:  Pt presented to her PCP with c/o hair loss, dry skin and fatigue in 11/2019 . TSH was normal at the time ( she was on Biotin). Repeat TSH at our office in 02/2020 was noted to have a TSH of 7.49 uIU/mL and was started on LT-4 replacement at the time.    Sister with Graves' Disease  SUBJECTIVE:    Today (09/03/2021):  [name] is here for a follow up on hypothyroidism .   Weight has been increasing  over the past year Continues with heat intolerance   Sees a therapist for depression  Continues with constipation  Denies local neck symptoms  Uses artifical tear drops for dry eyes  She is tired, but does not sleep well at night   She takes her Anti- hypertensive meds at night during to urinary frequency  Pending bladder sx   No biotin use   She is on Levothyroxine 50 mcg daily     HISTORY:  Past Medical History:  Past Medical History:  Diagnosis Date   Anxiety    Asthma    GERD (gastroesophageal reflux disease)    Hypertension    PONV (postoperative nausea and vomiting)    Sleep apnea    non compliant with CPAP   Thyroid disease    Past Surgical History:  Past Surgical History:  Procedure Laterality Date   ABDOMINAL HYSTERECTOMY     CHOLECYSTECTOMY     JOINT REPLACEMENT     SHOULDER ARTHROSCOPY WITH SUBACROMIAL DECOMPRESSION, ROTATOR CUFF REPAIR AND BICEP TENDON REPAIR Left 04/01/2019   Procedure: LEFT SHOULDER ARTHROSCOPY WITH SUBACROMIAL DECOMPRESSION, DISTAL CLAVICLE EXCISION, BICEP  TENODESIS, ROTATOR CUFF REPAIR, spinal needle decrompression of glenoid cyst;  Surgeon: Sheral Apley, MD;  Location: [facility];  Service: Orthopedics;  Laterality: Left;   Social History:  reports that she quit smoking about 12 years ago. Her smoking use included cigarettes. She has never used smokeless tobacco. She reports current alcohol use. She reports that she does not use drugs. Family History:  Family History  Problem Relation Age of Onset   Breast cancer Mother 20   Dementia Mother    Drug abuse Mother    Breast cancer Sister 34   Stomach cancer Father    Heart disease Father    Obesity Brother    Breast cancer Paternal Grandmother      HOME MEDICATIONS: Allergies as of 09/03/2021       Reactions   Oxcarbazepine Nausea Only   Codeine Nausea Only        Medication List        Accurate as of September 03, 2021  9:48 AM. If you have any questions, ask your nurse or doctor.          STOP taking these medications    losartan-hydrochlorothiazide 50-12.5 MG tablet Commonly known as: HYZAAR Replaced by: losartan-hydrochlorothiazide 100-12.5 MG tablet Stopped by: Scarlette Shorts, MD       TAKE these medications    cetirizine  10 MG tablet Commonly known as: ZYRTEC Take 10 mg by mouth daily.   clonazePAM 0.5 MG tablet Commonly known as: KLONOPIN TAKE 1 TABLET(0.5 MG) BY MOUTH TWICE DAILY AS NEEDED FOR ANXIETY   DULoxetine 20 MG capsule Commonly known as: CYMBALTA Take 40 mg by mouth daily.   estradiol 2 MG tablet Commonly known as: ESTRACE Take 2 mg by mouth daily.   Eszopiclone 3 MG Tabs Take 1 tablet (3 mg total) by mouth at bedtime as needed.   Fish Oil 1000 MG Caps Take by mouth.   fluticasone 50 MCG/ACT nasal spray Commonly known as: FLONASE   levothyroxine 50 MCG tablet Commonly known as: SYNTHROID Take 1 tablet (50 mcg total) by mouth daily before breakfast.   losartan-hydrochlorothiazide 100-12.5 MG  tablet Commonly known as: HYZAAR Take 1 tablet by mouth daily. Replaces: losartan-hydrochlorothiazide 50-12.5 MG tablet Started by: Scarlette ShortsIbtehal J Yul Diana, MD   meloxicam 15 MG tablet Commonly known as: MOBIC Take 15 mg by mouth daily.   modafinil 200 MG tablet Commonly known as: PROVIGIL Take 0.5 tablets (100 mg total) by mouth daily.   omeprazole 40 MG capsule Commonly known as: PRILOSEC Take 40 mg by mouth daily.   PROBIOTIC-10 PO Take by mouth.   UNABLE TO FIND CPAP   Vitamin D (Ergocalciferol) 1.25 MG (50000 UNIT) Caps capsule Commonly known as: DRISDOL Take 50,000 Units by mouth every 7 (seven) days.   vitamin E 1000 UNIT capsule Take 1,000 Units by mouth daily.          OBJECTIVE:   PHYSICAL EXAM: VS: BP (!) 168/90 (BP Location: Left Arm, Patient Position: Sitting, Cuff Size: Small)    Pulse 90    Ht 5' 2.5" (1.588 m)    Wt 183 lb (83 kg)    SpO2 97%    BMI 32.94 kg/m    EXAM: General: Pt appears well and is in NAD  Neck: General: Supple without adenopathy. Thyroid: Thyroid size normal.  No goiter or nodules appreciated.  Lungs: Clear with good BS bilat with no rales, rhonchi, or wheezes  Heart: Auscultation: RRR.  Mental Status: Judgment, insight: Intact Orientation: Oriented to time, place, and person Mood and affect: No depression, anxiety, or agitation     DATA REVIEWED:   Latest Reference Range & Units 09/03/21 10:14  TSH 0.35 - 5.50 uIU/mL 2.04      12/22/2019 TSH 3.22 uiu/mL Total t4 12.7 ( 5.14-11.9)   ASSESSMENT / PLAN / RECOMMENDATIONS:   Hypothyroidism:   - Pt with non-specific symptoms  - No  local neck symptoms  - TSh normal , no changes      Medications   Continue Levothyroxine 50 mcg daily     2.  HTN :   - BP out of control. Pt states sometimes her systolic is 200 mmhg. She initially asked me not to change her Hyzaar dose but I discussed her increased risk of CVA and CAD with uncontrolled HTN  - I am going  to increase Losartan but keep the HCTZ the same due to frequency  - Pt advised to follow up with PCP in a week for BP recheck and medication adjustment        F/U in 1 yr    Signed electronically by: Lyndle HerrlichAbby Jaralla Jaxxen Voong, MD  [facility]  [facility] [address] [place], [place] [address] Phone: [phone] FAX: [phone]      CC: Leilani Ableeese, Betti, MD [address] [place]  [place] [address] Phone: [phone]  Fax: [phone]   Return to Endocrinology clinic as below: Future Appointments  Date Time Provider Department Center  09/03/2021  1:00 PM Melony Overly T, PA-C CP-CP None

## 2021-10-22 ENCOUNTER — Other Ambulatory Visit: Payer: Self-pay | Admitting: Physician Assistant

## 2021-11-23 HISTORY — PX: OTHER SURGICAL HISTORY: SHX169

## 2021-12-04 ENCOUNTER — Encounter: Payer: Self-pay | Admitting: Physician Assistant

## 2021-12-04 ENCOUNTER — Ambulatory Visit (INDEPENDENT_AMBULATORY_CARE_PROVIDER_SITE_OTHER): Admitting: Physician Assistant

## 2021-12-04 DIAGNOSIS — F319 Bipolar disorder, unspecified: Secondary | ICD-10-CM

## 2021-12-04 MED ORDER — CAPLYTA 42 MG PO CAPS: 42.0000 mg | ORAL_CAPSULE | Freq: Every day | ORAL | 1 refills | Status: DC

## 2021-12-04 NOTE — Progress Notes (Signed)
[facility] ? ?Patient ID: Juline Patch,  ?MRN: [id number] ? ?PCP: Leilani Able, MD ? ?Date of Evaluation: 12/04/2021 ?Time spent:30 minutes ? ?Chief Complaint:  ?Chief Complaint   ?Anxiety; Depression; Insomnia; Follow-up ?  ? ? ?HISTORY/CURRENT STATUS: ?HPI  For routine med check. ? ?Needs something for mood swings, gotten really bad. Gets mad fast, irritable, doesn't want to be, she has been terrified of gaining weight and has not wanted to be on an antipsychotic or mood stabilizer.  She realizes she has got to do something.  Wonders if going on the Jordan again would be helpful.  She is not having increased energy with decreased need for sleep but does have increased talkativeness sometimes.  But it is mostly the anger that is very quick and she is not sure what she might say or do if something hits her the wrong way.  No risky behaviors or increased spending.  No grandiosity.  No paranoia and no hallucinations. ? ?Patient denies loss of interest in usual activities and is able to enjoy things.  Is tired a lot.  But in talking it sounds like she has not been taking the modafinil.  Forgot she had it.  Appetite has not changed.  No extreme sadness, tearfulness, or feelings of hopelessness.  Denies any changes in concentration, making decisions or remembering things.  Does get anxious at times, really worked up if something does not set right.  Klonopin does help.  Denies suicidal or homicidal thoughts. ? ?Denies dizziness, syncope, seizures, numbness, tingling, tremor, tics, unsteady gait, slurred speech, confusion. Denies muscle or joint pain, stiffness, or dystonia. Denies unexplained weight loss, frequent infections, or sores that heal slowly.  No polyphagia, polydipsia, or polyuria. Denies visual changes or paresthesias.  ? ?Individual Medical History/ Review of Systems: Changes? :No  ? ?Past medications for mental health diagnoses include: ?Cymbalta causes sweating/hot flashes above 40 mg, Prozac  worked for [employer], Effexor caused her to be a zombie, Wellbutrin caused anger, Klonopin, Lunesta, Zoloft, Ambien, Trazodone, Trileptal caused severe nausea, Lamictal caused insomnia, Latuda caused h/a, had 'body heat not like hot flashes from menopause.' Sunosi too expensive. ? ?Allergies: Oxcarbazepine and Codeine ? ?Current Medications:  ?Current Outpatient Medications:  ?  cetirizine (ZYRTEC) 10 MG tablet, Take 10 mg by mouth daily., Disp: , Rfl:  ?  clonazePAM (KLONOPIN) 0.5 MG tablet, TAKE 1 TABLET(0.5 MG) BY MOUTH TWICE DAILY AS NEEDED FOR ANXIETY, Disp: 30 tablet, Rfl: 0 ?  DULoxetine (CYMBALTA) 20 MG capsule, Take 40 mg by mouth daily. , Disp: , Rfl:  ?  estradiol (ESTRACE) 2 MG tablet, Take 2 mg by mouth daily. , Disp: , Rfl:  ?  fluticasone (FLONASE) 50 MCG/ACT nasal spray, , Disp: , Rfl:  ?  levothyroxine (SYNTHROID) 50 MCG tablet, Take 1 tablet (50 mcg total) by mouth daily before breakfast., Disp: 90 tablet, Rfl: 3 ?  meloxicam (MOBIC) 15 MG tablet, Take 15 mg by mouth daily., Disp: , Rfl:  ?  omeprazole (PRILOSEC) 40 MG capsule, Take 40 mg by mouth daily. , Disp: , Rfl:  ?  Probiotic Product (PROBIOTIC-10 PO), Take by mouth., Disp: , Rfl:  ?  Vitamin D, Ergocalciferol, (DRISDOL) 50000 units CAPS capsule, Take 50,000 Units by mouth every 7 (seven) days., Disp: , Rfl:  ?  vitamin E 1000 UNIT capsule, Take 1,000 Units by mouth daily., Disp: , Rfl:  ?  Eszopiclone 3 MG TABS, Take 1 tablet (3 mg total) by mouth at bedtime as needed. (  Patient not taking: Reported on 09/03/2021), Disp: 30 tablet, Rfl: 5 ?  losartan-hydrochlorothiazide (HYZAAR) 100-12.5 MG tablet, Take 1 tablet by mouth daily., Disp: 90 tablet, Rfl: 0 ?  lurasidone (LATUDA) 20 MG TABS tablet, Take 1 tablet (20 mg total) by mouth daily with supper., Disp: 30 tablet, Rfl: 1 ?  modafinil (PROVIGIL) 200 MG tablet, Take 0.5 tablets (100 mg total) by mouth daily. (Patient not taking: Reported on 12/04/2021), Disp: 30 tablet, Rfl: 3 ?  Omega-3 Fatty  Acids (FISH OIL) 1000 MG CAPS, Take by mouth. (Patient not taking: Reported on 12/04/2021), Disp: , Rfl:  ?  UNABLE TO FIND, CPAP (Patient not taking: Reported on 12/04/2021), Disp: , Rfl:  ?Medication Side Effects: none ? ?Family Medical/ Social History: Changes?  See HPI. ? ? ?MENTAL HEALTH EXAM: ? ?There were no vitals taken for this visit.There is no height or weight on file to calculate BMI.  ?General Appearance: Casual, Well Groomed, and Obese  ?Eye Contact:  Good  ?Speech:  Clear and Coherent and Normal Rate  ?Volume:  Normal  ?Mood:  Irritable  ?Affect:  Congruent  ?Thought Process:  Goal Directed and Descriptions of Associations: Circumstantial  ?Orientation:  Full (Time, Place, and Person)  ?Thought Content: Logical   ?Suicidal Thoughts:  No  ?Homicidal Thoughts:  No  ?Memory:  WNL  ?Judgement:  Good  ?Insight:  Good  ?Psychomotor Activity:  Normal  ?Concentration:  Concentration: Good and Attention Span: Good  ?Recall:  Good  ?Fund of Knowledge: Good  ?Language: Good  ?Assets:  Desire for Improvement ?Financial Resources/Insurance ?Housing ?Resilience  ?ADL's:  Intact  ?Cognition: WNL  ?Prognosis:  Good  ? ?GeneSight test results are on chart under media/scan. ? ?DIAGNOSES:  ?  ICD-10-CM   ?1. Bipolar I disorder (HCC)  F31.9   ?  ? ? ? ?Receiving Psychotherapy: No  ? ? ?RECOMMENDATIONS:  ?PDMP reviewed.  Last Klonopin 10/24/2021.  Modafinil filled 10/17/2021.  Lunesta filled 09/26/2021. ?I provided 30 minutes of face to face time during this encounter, including time spent before and after the visit in records review, medical decision making, counseling pertinent to today's visit, and charting.  ?We discussed adding an antipsychotic, I think it is a good idea and will help stabilize her mood.  Other options would be Depakote, Trileptal, or Tegretol but they do have a higher risk of weight gain.  She did take the [facility] for a short while in the past.  We also discussed some of the newer drugs specifically  Caplyta, Vraylar, or Rexulti.  They can have decreased side effects of weight gain especially in some of the older drugs.Discussed potential metabolic side effects associated with atypical antipsychotics.  Labs will need to be drawn periodically to monitor metabolic function. Discussed potential risk for movement side effects. Patient understands and accepts these risks and has been advised to contact office if any movement side effects occur. We agreed to try Caplyta.  Her insurance may not approve it however and if that is the case then we will use Latuda.  She understands. ? ?Continue Klonopin 0.5 mg 1 p.o. twice daily as needed. ?Continue Cymbalta 20 mg, 2 p.o. qd.  ?Start Caplyta 42 mg daily. ?Restart modafinil 200 mg, 1/2 pill every morning as needed. ?Continue vitamin D 50,000 units weekly. ?Continue CPAP use. ?Return in 6 weeks. ? ?Melony Overly, PA-C  ?

## 2021-12-05 ENCOUNTER — Telehealth: Payer: Self-pay | Admitting: Physician Assistant

## 2021-12-05 ENCOUNTER — Other Ambulatory Visit: Payer: Self-pay | Admitting: Physician Assistant

## 2021-12-05 MED ORDER — LURASIDONE HCL 20 MG PO TABS: 20.0000 mg | ORAL_TABLET | Freq: Every day | ORAL | 1 refills | Status: DC

## 2021-12-05 NOTE — Telephone Encounter (Signed)
Pharmacy - [facility] on [address] ?

## 2021-12-05 NOTE — Telephone Encounter (Signed)
Prescription was sent

## 2021-12-05 NOTE — Telephone Encounter (Signed)
Pt LVM @ 2:49 asking Rosey Bath to call her about a prescription. ? ?Next appt 6/5 ?

## 2021-12-05 NOTE — Telephone Encounter (Signed)
LVM to [facility] 

## 2021-12-05 NOTE — Telephone Encounter (Signed)
Patient notified. Pharmacy is [facility] on [address] [place] ?

## 2021-12-05 NOTE — Telephone Encounter (Signed)
Patient called about Caplyta. She said she is exhausted and doesn't want to do anything -"it is just like I've smoked pot all day." She only took one dose this morning. She is also c/o dry mouth. I told her she could try taking it at night, but said she wears a CPAP and a dry mouth is problematic for her. She is asking if she should switch back to Jordan.  ?

## 2021-12-05 NOTE — Telephone Encounter (Signed)
Yes, I will send in a prescription for 20 mg, please get her pharmacy.  Reminded her that she has to take the Jordan with food, at least 350 cal.  Thank you ?Traci, I am not sure if you have received a PA on the Caplyta from [facility], disregard if so.  Thanks

## 2021-12-08 ENCOUNTER — Encounter: Payer: Self-pay | Admitting: Physician Assistant

## 2022-01-16 ENCOUNTER — Other Ambulatory Visit: Payer: Self-pay | Admitting: Family Medicine

## 2022-01-16 DIAGNOSIS — Z1231 Encounter for screening mammogram for malignant neoplasm of breast: Secondary | ICD-10-CM

## 2022-01-27 ENCOUNTER — Ambulatory Visit: Admitting: Physician Assistant

## 2022-02-04 ENCOUNTER — Ambulatory Visit
Admission: RE | Admit: 2022-02-04 | Discharge: 2022-02-04 | Disposition: A | Discharge status: Home / Self Care | Source: Ambulatory Visit | Attending: Family Medicine | Admitting: Family Medicine

## 2022-02-04 DIAGNOSIS — Z1231 Encounter for screening mammogram for malignant neoplasm of breast: Secondary | ICD-10-CM

## 2022-02-05 ENCOUNTER — Encounter (HOSPITAL_BASED_OUTPATIENT_CLINIC_OR_DEPARTMENT_OTHER): Payer: Self-pay | Admitting: Emergency Medicine

## 2022-02-05 ENCOUNTER — Other Ambulatory Visit: Payer: Self-pay

## 2022-02-05 ENCOUNTER — Emergency Department (HOSPITAL_BASED_OUTPATIENT_CLINIC_OR_DEPARTMENT_OTHER)
Admission: EM | Admit: 2022-02-05 | Discharge: 2022-02-06 | Disposition: A | Discharge status: Home / Self Care | Attending: Emergency Medicine | Admitting: Emergency Medicine

## 2022-02-05 DIAGNOSIS — R197 Diarrhea, unspecified: Secondary | ICD-10-CM | POA: Diagnosis not present

## 2022-02-05 DIAGNOSIS — R519 Headache, unspecified: Secondary | ICD-10-CM | POA: Insufficient documentation

## 2022-02-05 DIAGNOSIS — R112 Nausea with vomiting, unspecified: Secondary | ICD-10-CM | POA: Diagnosis present

## 2022-02-05 LAB — CBC WITH DIFFERENTIAL/PLATELET
Abs Immature Granulocytes: 0.06 10*3/uL (ref 0.00–0.07)
Basophils Absolute: 0.1 10*3/uL (ref 0.0–0.1)
Basophils Relative: 0 %
Eosinophils Absolute: 0.1 10*3/uL (ref 0.0–0.5)
Eosinophils Relative: 0 %
HCT: 42.2 % (ref 36.0–46.0)
Hemoglobin: 14.8 g/dL (ref 12.0–15.0)
Immature Granulocytes: 0 %
Lymphocytes Relative: 6 %
Lymphs Abs: 0.9 10*3/uL (ref 0.7–4.0)
MCH: 29.2 pg (ref 26.0–34.0)
MCHC: 35.1 g/dL (ref 30.0–36.0)
MCV: 83.4 fL (ref 80.0–100.0)
Monocytes Absolute: 0.4 10*3/uL (ref 0.1–1.0)
Monocytes Relative: 3 %
Neutro Abs: 13.9 10*3/uL — ABNORMAL HIGH (ref 1.7–7.7)
Neutrophils Relative %: 91 %
Platelets: 322 10*3/uL (ref 150–400)
RBC: 5.06 MIL/uL (ref 3.87–5.11)
RDW: 12.7 % (ref 11.5–15.5)
WBC: 15.4 10*3/uL — ABNORMAL HIGH (ref 4.0–10.5)
nRBC: 0 % (ref 0.0–0.2)

## 2022-02-05 LAB — COMPREHENSIVE METABOLIC PANEL
ALT: 25 U/L (ref 0–44)
AST: 27 U/L (ref 15–41)
Albumin: 3.5 g/dL (ref 3.5–5.0)
Alkaline Phosphatase: 65 U/L (ref 38–126)
Anion gap: 10 (ref 5–15)
BUN: 16 mg/dL (ref 8–23)
CO2: 26 mmol/L (ref 22–32)
Calcium: 8.6 mg/dL — ABNORMAL LOW (ref 8.9–10.3)
Chloride: 101 mmol/L (ref 98–111)
Creatinine, Ser: 0.7 mg/dL (ref 0.44–1.00)
GFR, Estimated: >60 mL/min (ref >60)
Glucose, Bld: 129 mg/dL — ABNORMAL HIGH (ref 70–99)
Potassium: 3.1 mmol/L — ABNORMAL LOW (ref 3.5–5.1)
Sodium: 137 mmol/L (ref 135–145)
Total Bilirubin: 0.6 mg/dL (ref 0.3–1.2)
Total Protein: 7.7 g/dL (ref 6.5–8.1)

## 2022-02-05 LAB — LIPASE, BLOOD: Lipase: 32 U/L (ref 11–51)

## 2022-02-05 MED ORDER — ONDANSETRON HCL 4 MG/2ML IJ SOLN
4.0000 mg | Freq: Once | INTRAMUSCULAR | Status: AC
  Administered 2022-02-05: 4 mg via INTRAVENOUS
  Filled 2022-02-05: qty 2

## 2022-02-05 MED ORDER — SODIUM CHLORIDE 0.9 % IV BOLUS
1000.0000 mL | Freq: Once | INTRAVENOUS | Status: AC
  Administered 2022-02-06: 1000 mL via INTRAVENOUS

## 2022-02-05 MED ORDER — DROPERIDOL 2.5 MG/ML IJ SOLN
1.2500 mg | Freq: Once | INTRAMUSCULAR | Status: AC
  Administered 2022-02-06: 1.25 mg via INTRAVENOUS
  Filled 2022-02-05: qty 2

## 2022-02-05 MED ORDER — SODIUM CHLORIDE 0.9 % IV BOLUS
1000.0000 mL | Freq: Once | INTRAVENOUS | Status: AC
Start: 2022-02-05 — End: 2022-02-06
  Administered 2022-02-05: 1000 mL via INTRAVENOUS

## 2022-02-05 MED ORDER — DIPHENHYDRAMINE HCL 50 MG/ML IJ SOLN
25.0000 mg | Freq: Once | INTRAMUSCULAR | Status: AC
  Administered 2022-02-06: 25 mg via INTRAVENOUS
  Filled 2022-02-05: qty 1

## 2022-02-05 NOTE — ED Triage Notes (Signed)
Pt is c/o vomiting and diarrhea since early this morning  Pt states she has a migraine and it is getting worse  Pt states she is having pain in her right ear and has soreness to her neck  Pt is currently taking Zofran 4mg  and sumatriptan 25 mg that she took about an hour ago

## 2022-02-06 MED ORDER — PROMETHAZINE HCL 25 MG PO TABS: 25.0000 mg | ORAL_TABLET | Freq: Four times a day (QID) | ORAL | 0 refills | Status: AC | PRN

## 2022-02-06 NOTE — ED Provider Notes (Signed)
[facility] EMERGENCY DEPARTMENT Provider Note   CSN: [id number] Arrival date & time: 02/05/22  2141     History  Chief Complaint  Patient presents with   Emesis    Diarrhea    Headache    Emma [name] is a 61 y.o. female.  61 yo F with a chief complaints of a headache.  This been going on since this morning.  Sudden onset and then progressively worsening.  Has had some nausea vomiting and diarrhea with this.  She gets headaches chronically but does not typically have diarrhea with them.  No known sick contacts.  Feels like her ears for right greater than left.  Denies trauma to the head of the neck.  Denies one-sided numbness or weakness denies difficulty speech or swallowing.   Emesis Associated symptoms: headaches   Headache Associated symptoms: vomiting        Home Medications Prior to Admission medications   Medication Sig Start Date End Date Taking? Authorizing Provider  promethazine (PHENERGAN) 25 MG tablet Take 1 tablet (25 mg total) by mouth every 6 (six) hours as needed for nausea or vomiting. 02/06/22  Yes Melene Plan, DO  cetirizine (ZYRTEC) 10 MG tablet Take 10 mg by mouth daily.    [provider]  clonazePAM (KLONOPIN) 0.5 MG tablet TAKE 1 TABLET(0.5 MG) BY MOUTH TWICE DAILY AS NEEDED FOR ANXIETY 10/24/21   Hurst, Rosey Bath T, PA-C  DULoxetine (CYMBALTA) 20 MG capsule Take 40 mg by mouth daily.     [provider]  estradiol (ESTRACE) 2 MG tablet Take 2 mg by mouth daily.     [provider]  Eszopiclone 3 MG TABS Take 1 tablet (3 mg total) by mouth at bedtime as needed. Patient not taking: Reported on 09/03/2021 04/22/21   Melony Overly T, PA-C  fluticasone [facility]) 50 MCG/ACT nasal spray  10/04/19   [provider]  levothyroxine (SYNTHROID) 50 MCG tablet Take 1 tablet (50 mcg total) by mouth daily before breakfast. 09/03/21   Shamleffer, Konrad Dolores, MD  losartan-hydrochlorothiazide (HYZAAR) 100-12.5 MG tablet Take 1  tablet by mouth daily. 09/03/21   Shamleffer, Konrad Dolores, MD  lurasidone (LATUDA) 20 MG TABS tablet Take 1 tablet (20 mg total) by mouth daily with supper. 12/05/21   Cherie Ouch, PA-C  meloxicam (MOBIC) 15 MG tablet Take 15 mg by mouth daily.    [provider]  modafinil (PROVIGIL) 200 MG tablet Take 0.5 tablets (100 mg total) by mouth daily. Patient not taking: Reported on 12/04/2021 09/03/21   Melony Overly T, PA-C  Omega-3 Fatty Acids (FISH OIL) 1000 MG CAPS Take by mouth. Patient not taking: Reported on 12/04/2021    [provider]  omeprazole (PRILOSEC) 40 MG capsule Take 40 mg by mouth daily.     [provider]  Probiotic Product (PROBIOTIC-10 PO) Take by mouth.    [provider]  UNABLE TO FIND CPAP Patient not taking: Reported on 12/04/2021    [provider]  Vitamin D, Ergocalciferol, (DRISDOL) 50000 units CAPS capsule Take 50,000 Units by mouth every 7 (seven) days.    [provider]  vitamin E 1000 UNIT capsule Take 1,000 Units by mouth daily.    [provider]      Allergies    Oxcarbazepine and Codeine    Review of Systems   Review of Systems  Gastrointestinal:  Positive for vomiting.  Neurological:  Positive for headaches.    Physical Exam Updated Vital Signs  BP (!) 131/104   Pulse (!) 113   Temp 99.3 F (37.4 C) (Oral)   Resp 20   Ht 5\' 2"  (1.575 m)   Wt 79.4 kg   SpO2 95%   BMI 32.01 kg/m  Physical Exam Vitals and nursing note reviewed.  Constitutional:      General: She is not in acute distress.    Appearance: She is well-developed. She is not diaphoretic.  HENT:     Head: Normocephalic and atraumatic.  Eyes:     Pupils: Pupils are equal, round, and reactive to light.  Cardiovascular:     Rate and Rhythm: Regular rhythm. Tachycardia present.     Heart sounds: No murmur heard.    No friction rub. No gallop.  Pulmonary:     Effort: Pulmonary effort is normal.     Breath sounds:  No wheezing or rales.  Abdominal:     General: There is no distension.     Palpations: Abdomen is soft.     Tenderness: There is no abdominal tenderness.  Musculoskeletal:        General: No tenderness.     Cervical back: Normal range of motion and neck supple.  Skin:    General: Skin is warm and dry.  Neurological:     Mental Status: She is alert and oriented to person, place, and time.     GCS: GCS eye subscore is 4. GCS verbal subscore is 5. GCS motor subscore is 6.     Cranial Nerves: Cranial nerves 2-12 are intact.     Sensory: Sensation is intact.     Motor: Motor function is intact.     Coordination: Coordination is intact.  Psychiatric:        Behavior: Behavior normal.     ED Results / Procedures / Treatments   Labs (all labs ordered are listed, but only abnormal results are displayed) Labs Reviewed  CBC WITH DIFFERENTIAL/PLATELET - Abnormal; Notable for the following components:      Result Value   WBC 15.4 (*)    Neutro Abs 13.9 (*)    All other components within normal limits  COMPREHENSIVE METABOLIC PANEL - Abnormal; Notable for the following components:   Potassium 3.1 (*)    Glucose, Bld 129 (*)    Calcium 8.6 (*)    All other components within normal limits  LIPASE, BLOOD    EKG None  Radiology MM 3D SCREEN BREAST BILATERAL  Result Date: 02/05/2022 CLINICAL DATA:  Screening. EXAM: DIGITAL SCREENING BILATERAL MAMMOGRAM WITH TOMOSYNTHESIS AND CAD TECHNIQUE: Bilateral screening digital craniocaudal and mediolateral oblique mammograms were obtained. Bilateral screening digital breast tomosynthesis was performed. The images were evaluated with computer-aided detection. COMPARISON:  Previous exam(s). ACR Breast Density Category b: There are scattered areas of fibroglandular density. FINDINGS: There are no findings suspicious for malignancy. IMPRESSION: No mammographic evidence of malignancy. A result letter of this screening mammogram will be mailed directly to  the patient. RECOMMENDATION: Screening mammogram in one year. (Code:SM-B-01Y) BI-RADS CATEGORY  1: Negative. Electronically Signed   By: 02/07/2022 M.D.   On: 02/05/2022 09:29    Procedures Procedures    Medications Ordered in ED Medications  sodium chloride 0.9 % bolus 1,000 mL (0 mLs Intravenous Stopped 02/06/22 0028)  ondansetron (ZOFRAN) injection 4 mg (4 mg Intravenous Given 02/05/22 2336)  droperidol (INAPSINE) 2.5 MG/ML injection 1.25 mg (1.25 mg Intravenous Given 02/06/22 0026)  diphenhydrAMINE (BENADRYL) injection 25 mg (25 mg Intravenous Given 02/06/22 0025)  sodium  chloride 0.9 % bolus 1,000 mL (0 mLs Intravenous Stopped 02/06/22 0113)    ED Course/ Medical Decision Making/ A&P                           Medical Decision Making Risk Prescription drug management.   61 yo F with a chief complaint of a headache.  Feels like her typical migraines but associated with diarrhea which is different for her.  Could be viral syndrome.  Has signs of that on exam.  She is tachycardic and a laboratory evaluation was obtained.  No significant electrolyte abnormality no significant anemia.  She was given a headache cocktail with significant improvement of her symptoms.  She was persistently tachycardic but was declining further observation and would like to go home.  We will have her follow-up with her family doctor.  2:20 AM:  I have discussed the diagnosis/risks/treatment options with the patient.  Evaluation and diagnostic testing in the emergency department does not suggest an emergent condition requiring admission or immediate intervention beyond what has been performed at this time.  They will follow up with  PCP. We also discussed returning to the ED immediately if new or worsening sx occur. We discussed the sx which are most concerning (e.g., sudden worsening pain, fever, inability to tolerate by mouth) that necessitate immediate return. Medications administered to the patient during  their visit and any new prescriptions provided to the patient are listed below.  Medications given during this visit Medications  sodium chloride 0.9 % bolus 1,000 mL (0 mLs Intravenous Stopped 02/06/22 0028)  ondansetron (ZOFRAN) injection 4 mg (4 mg Intravenous Given 02/05/22 2336)  droperidol (INAPSINE) 2.5 MG/ML injection 1.25 mg (1.25 mg Intravenous Given 02/06/22 0026)  diphenhydrAMINE (BENADRYL) injection 25 mg (25 mg Intravenous Given 02/06/22 0025)  sodium chloride 0.9 % bolus 1,000 mL (0 mLs Intravenous Stopped 02/06/22 0113)     The patient appears reasonably screen and/or stabilized for discharge and I doubt any other medical condition or other [facility] requiring further screening, evaluation, or treatment in the ED at this time prior to discharge.          Final Clinical Impression(s) / ED Diagnoses Final diagnoses:  Nausea vomiting and diarrhea  Bad headache    Rx / DC Orders ED Discharge Orders          Ordered    promethazine (PHENERGAN) 25 MG tablet  Every 6 hours PRN        02/06/22 0107              Melene Plan, DO 02/06/22 0220

## 2022-02-06 NOTE — Discharge Instructions (Signed)
I prescribed you nausea medicine you can take that as needed for nausea.  Please take Imodium which you can get over-the-counter for diarrhea.  Follow the instructions on the bottle.  Please follow with your family doctor.  Return for worsening symptoms.

## 2022-02-09 ENCOUNTER — Other Ambulatory Visit: Payer: Self-pay | Admitting: Physician Assistant

## 2022-03-17 ENCOUNTER — Other Ambulatory Visit (HOSPITAL_BASED_OUTPATIENT_CLINIC_OR_DEPARTMENT_OTHER): Payer: Self-pay

## 2022-03-17 DIAGNOSIS — R0681 Apnea, not elsewhere classified: Secondary | ICD-10-CM

## 2022-03-28 ENCOUNTER — Ambulatory Visit (INDEPENDENT_AMBULATORY_CARE_PROVIDER_SITE_OTHER): Admitting: Physician Assistant

## 2022-03-28 ENCOUNTER — Encounter: Payer: Self-pay | Admitting: Physician Assistant

## 2022-03-28 DIAGNOSIS — R5381 Other malaise: Secondary | ICD-10-CM | POA: Diagnosis not present

## 2022-03-28 DIAGNOSIS — F411 Generalized anxiety disorder: Secondary | ICD-10-CM | POA: Diagnosis not present

## 2022-03-28 DIAGNOSIS — F3130 Bipolar disorder, current episode depressed, mild or moderate severity, unspecified: Secondary | ICD-10-CM | POA: Diagnosis not present

## 2022-03-28 DIAGNOSIS — F99 Mental disorder, not otherwise specified: Secondary | ICD-10-CM

## 2022-03-28 DIAGNOSIS — G4733 Obstructive sleep apnea (adult) (pediatric): Secondary | ICD-10-CM

## 2022-03-28 DIAGNOSIS — R454 Irritability and anger: Secondary | ICD-10-CM

## 2022-03-28 DIAGNOSIS — F5105 Insomnia due to other mental disorder: Secondary | ICD-10-CM | POA: Diagnosis not present

## 2022-03-28 DIAGNOSIS — R5383 Other fatigue: Secondary | ICD-10-CM

## 2022-03-28 MED ORDER — ESZOPICLONE 2 MG PO TABS: 2.0000 mg | ORAL_TABLET | Freq: Every evening | ORAL | 2 refills | Status: DC | PRN

## 2022-03-28 MED ORDER — CLONAZEPAM 0.5 MG PO TABS: 0.5000 mg | ORAL_TABLET | Freq: Two times a day (BID) | ORAL | 2 refills | Status: DC | PRN

## 2022-03-28 NOTE — Progress Notes (Unsigned)
[facility]  Patient ID: Emma [name],  MRN: [id number]  PCP: Lin Landsman, MD  Date of Evaluation: 03/28/2022  time spent:30 minutes  Chief Complaint:  Chief Complaint   Anxiety; Depression    HISTORY/CURRENT STATUS: HPI  For routine med check.  Has depression around twice a month that lasts at least 3 days each time. All she does is sleep. No motivation or energy. Stares at the wall, doesn't want to watch tv or anything. Won't eat. Only drinks water. Gets up to go to the bathroom. Sometimes goes to the couch. Sometimes triggered when her kids aren't treating her right. She's gotten better as far as boundaries, if they start acting mean, she hangs up the phone or leaves.When she's not depressed, has trouble falling asleep and staying asleep. PCP has given her Johnnye Sima but the dose is too high and she feels groggy all the next day.  Trouble remembering, worse in past year, using CPAP but hasn't helped that part. Still doesn't work by choice. Appetite is nl, weight is stable.  She is on [facility] which has decreased her appetite.  When depressed, doesn't shower daily, might be 3 days in between. No SI/HI.   Does get irritable and angry easily. Didn't tolerate caplyta. Taking Klonopin which helps when gets irritable.  Doesn't take it often. Not having PA, but does get overwhelmed easy at times. Patient denies increased energy with decreased need for sleep, increased talkativeness, racing thoughts, impulsivity or risky behaviors, increased spending, increased libido, grandiosity, paranoia, and no hallucinations.  Review of Systems  Constitutional:  Positive for malaise/fatigue.  HENT: Negative.    Eyes: Negative.   Respiratory: Negative.    Cardiovascular: Negative.   Gastrointestinal: Negative.   Genitourinary: Negative.   Musculoskeletal: Negative.   Skin: Negative.   Neurological: Negative.   Endo/Heme/Allergies: Negative.   Psychiatric/Behavioral:         See HPI     Individual Medical History/ Review of Systems: Changes? :Yes   had n/v/d, went to ER 02/05/2022. See note  Past medications for mental health diagnoses include: Cymbalta causes sweating/hot flashes above 40 mg, Prozac worked for [employer], Effexor caused her to be a zombie, Wellbutrin caused anger, Klonopin, Lunesta, Zoloft, Ambien, Trazodone, Trileptal caused severe nausea, Lamictal caused insomnia, Latuda caused h/a, had 'body heat not like hot flashes from menopause.' Sunosi too expensive, Caplyta made her 'feel like she's smoked pot all day' after 1 pill.   Allergies: Latuda [lurasidone], Oxcarbazepine, and Codeine  Current Medications:  Current Outpatient Medications:    b complex vitamins capsule, Take 1 capsule by mouth daily., Disp: , Rfl:    cetirizine (ZYRTEC) 10 MG tablet, Take 10 mg by mouth daily., Disp: , Rfl:    DULoxetine (CYMBALTA) 20 MG capsule, Take 40 mg by mouth daily. , Disp: , Rfl:    estradiol (ESTRACE) 2 MG tablet, Take 2 mg by mouth daily. , Disp: , Rfl:    eszopiclone (LUNESTA) 2 MG TABS tablet, Take 1 tablet (2 mg total) by mouth at bedtime as needed for sleep. Take immediately before bedtime, Disp: 30 tablet, Rfl: 2   fluticasone (FLONASE) 50 MCG/ACT nasal spray, , Disp: , Rfl:    levothyroxine (SYNTHROID) 50 MCG tablet, Take 1 tablet (50 mcg total) by mouth daily before breakfast., Disp: 90 tablet, Rfl: 3   meloxicam (MOBIC) 15 MG tablet, Take 15 mg by mouth daily., Disp: , Rfl:    omeprazole (PRILOSEC) 40 MG capsule, Take 40 mg by mouth daily. ,  Disp: , Rfl:    Probiotic Product (PROBIOTIC-10 PO), Take by mouth., Disp: , Rfl:    promethazine (PHENERGAN) 25 MG tablet, Take 1 tablet (25 mg total) by mouth every 6 (six) hours as needed for nausea or vomiting., Disp: 30 tablet, Rfl: 0   tirzepatide (MOUNJARO) 2.5 MG/0.5ML Pen, Inject 2.5 mg into the skin once a week., Disp: , Rfl:    vitamin E 1000 UNIT capsule, Take 1,000 Units by mouth daily., Disp: , Rfl:     clonazePAM (KLONOPIN) 0.5 MG tablet, Take 1 tablet (0.5 mg total) by mouth 2 (two) times daily as needed for anxiety., Disp: 30 tablet, Rfl: 2   losartan-hydrochlorothiazide (HYZAAR) 100-12.5 MG tablet, Take 1 tablet by mouth daily., Disp: 90 tablet, Rfl: 0   Omega-3 Fatty Acids (FISH OIL) 1000 MG CAPS, Take by mouth. (Patient not taking: Reported on 12/04/2021), Disp: , Rfl:    UNABLE TO FIND, CPAP (Patient not taking: Reported on 12/04/2021), Disp: , Rfl:  Medication Side Effects: none  Family Medical/ Social History: Changes? none  MENTAL HEALTH EXAM:  There were no vitals taken for this visit.There is no height or weight on file to calculate BMI.  General Appearance: Casual, Well Groomed, and Obese  Eye Contact:  Good  Speech:  Clear and Coherent and Normal Rate  Volume:  Normal  Mood:  Depressed  Affect:  Congruent  Thought Process:  Goal Directed and Descriptions of Associations: Circumstantial  Orientation:  Full (Time, Place, and Person)  Thought Content: Logical   Suicidal Thoughts:  No  Homicidal Thoughts:  No  Memory:  WNL  Judgement:  Good  Insight:  Good  Psychomotor Activity:  Normal  Concentration:  Concentration: Good and Attention Span: Good  Recall:  Good  Fund of Knowledge: Good  Language: Good  Assets:  Desire for Improvement Financial Resources/Insurance Housing Resilience  ADL's:  Intact  Cognition: WNL  Prognosis:  Good   GeneSight test results are on chart under media/scan.  Labs 02/05/2022 (see on chart)  Glu 129  DIAGNOSES:    ICD-10-CM   1. Bipolar I disorder, most recent episode depressed (HCC)  F31.30     2. Generalized anxiety disorder  F41.1     3. Insomnia due to other mental disorder  F51.05    F99     4. Malaise and fatigue  R53.81    R53.83     5. Obstructive sleep apnea  G47.33     6. Irritability and anger  R45.4       Receiving Psychotherapy: No   RECOMMENDATIONS:  PDMP reviewed.  Last Klonopin 10/24/2021.  Modafinil  filled 10/17/2021.  Lunesta filled 09/26/2021. I provided 30 minutes of face to face time during this encounter, including time spent before and after the visit in records review, medical decision making, counseling pertinent to today's visit, and charting.   Disc adding Rexulti. Shes terrified of weight gain and doesn't want to try it. I explained its minimal and doesn't cont over time like many AP. Reminded her shes on monjouro, also if shes inactive, in bed d/t depression at least 6 days per month, that causes increased weight or at least prevents loss. Benefits, SE of Rexulti given. Is willing to try.  Start Rexulti 0.5 mg daily, samples given. No rx sent until we see how she responds. Continue Klonopin 0.5 mg 1 p.o. twice daily as needed. Continue Cymbalta 20 mg, 2 p.o. qd. (Sweats too much at higher doses) Decrease Lunesta to 2  mg hs prn. (I can rx from now on if she prefers) Continue vitamin D 50,000 units weekly. Continue CPAP use. Return in 4 weeks.  Melony Overly, PA-C

## 2022-04-17 ENCOUNTER — Ambulatory Visit (HOSPITAL_BASED_OUTPATIENT_CLINIC_OR_DEPARTMENT_OTHER): Attending: Family Medicine | Admitting: Internal Medicine

## 2022-04-17 DIAGNOSIS — I493 Ventricular premature depolarization: Secondary | ICD-10-CM | POA: Insufficient documentation

## 2022-04-17 DIAGNOSIS — R0681 Apnea, not elsewhere classified: Secondary | ICD-10-CM

## 2022-04-17 DIAGNOSIS — G4733 Obstructive sleep apnea (adult) (pediatric): Secondary | ICD-10-CM | POA: Insufficient documentation

## 2022-04-20 DIAGNOSIS — R0681 Apnea, not elsewhere classified: Secondary | ICD-10-CM | POA: Diagnosis not present

## 2022-04-20 NOTE — Procedures (Signed)
    Patient Name: Emma [name], Emma [name] Date: 04/17/2022 Gender: Female D.O.B: 05-20-1961 Age (years): 61 Referring Provider: Leilani Able Height (inches): 63 Interpreting Physician: Jetty Duhamel MD, ABSM Weight (lbs): 177 RPSGT: Cherylann Parr BMI: 32 MRN: [id number] Neck Size: 15.00  CLINICAL INFORMATION Sleep Study Type: NPSG Indication for sleep study: Snoring, Witnesses Apnea / Gasping During Sleep Epworth Sleepiness Score: 3  SLEEP STUDY TECHNIQUE As per the AASM Manual for the Scoring of Sleep and Associated Events v2.3 (April 2016) with a hypopnea requiring 4% desaturations.  The channels recorded and monitored were frontal, central and occipital EEG, electrooculogram (EOG), submentalis EMG (chin), nasal and oral airflow, thoracic and abdominal wall motion, anterior tibialis EMG, snore microphone, electrocardiogram, and pulse oximetry.  MEDICATIONS Medications self-administered by patient taken the night of the study : "Ambien 2 mg" reported by tech  SLEEP ARCHITECTURE The study was initiated at 10:54:41 PM and ended at 5:04:06 AM.  Sleep onset time was 94.4 minutes and the sleep efficiency was 66.2%%. The total sleep time was 244.5 minutes.  Stage REM latency was 235.0 minutes.  The patient spent 5.9%% of the night in stage N1 sleep, 91.4%% in stage N2 sleep, 0.0%% in stage N3 and 2.7% in REM.  Alpha intrusion was absent.  Supine sleep was 29.30%.  RESPIRATORY PARAMETERS The overall apnea/hypopnea index (AHI) was 54.0 per hour. There were 71 total apneas, including 71 obstructive, 0 central and 0 mixed apneas. There were 149 hypopneas and 0 RERAs.  The AHI during Stage REM sleep was 27.7 per hour.  AHI while supine was 80.4 per hour.  The mean oxygen saturation was 93.0%. The minimum SpO2 during sleep was 84.0%.  moderate snoring was noted during this study.  CARDIAC DATA The 2 lead EKG demonstrated sinus rhythm. The mean heart rate was 81.9 beats per minute.  Other EKG findings include: PVCs.  LEG MOVEMENT DATA The total PLMS were 0 with a resulting PLMS index of 0.0. Associated arousal with leg movement index was 0.0 .  IMPRESSIONS - Severe obstructive sleep apnea occurred during this study (AHI = 54.0/h). - Mild oxygen desaturation was noted during this study (Min O2 = 84.0%). Mean 93% - The patient snored with moderate snoring volume. - Difficulty initiating sleep with sleep onset around 12:30 AM. - EKG findings include PVCs. - Clinically significant periodic limb movements did not occur during sleep. No significant associated arousals.  DIAGNOSIS - Obstructive Sleep Apnea (G47.33)  RECOMMENDATIONS - Suggest CPAP titration sleep study or autopap. Other options would be based on clinical judgment. - Be careful with alcohol, sedatives and other CNS depressants that may worsen sleep apnea and disrupt normal sleep architecture. - Sleep hygiene should be reviewed to assess factors that may improve sleep quality. - Weight management and regular exercise should be initiated or continued if appropriate.  [Electronically signed] 04/20/2022 12:01 PM  Jetty Duhamel MD, ABSM Diplomate, American Board of Sleep Medicine NPI: [id number]                         Jetty Duhamel Diplomate, American Board of Sleep Medicine  ELECTRONICALLY SIGNED ON:  04/20/2022, 11:58 AM  [facility] PH: [phone]   FX: [phone] ACCREDITED BY THE AMERICAN ACADEMY OF SLEEP MEDICINE

## 2022-04-24 ENCOUNTER — Encounter (HOSPITAL_BASED_OUTPATIENT_CLINIC_OR_DEPARTMENT_OTHER): Admitting: Internal Medicine

## 2022-05-01 ENCOUNTER — Ambulatory Visit: Admitting: Physician Assistant

## 2022-09-09 ENCOUNTER — Ambulatory Visit: Admitting: Internal Medicine

## 2022-10-13 ENCOUNTER — Other Ambulatory Visit: Payer: Self-pay | Admitting: Physician Assistant

## 2022-11-12 ENCOUNTER — Encounter: Payer: Self-pay | Admitting: Physician Assistant

## 2022-11-12 ENCOUNTER — Ambulatory Visit (INDEPENDENT_AMBULATORY_CARE_PROVIDER_SITE_OTHER): Admitting: Physician Assistant

## 2022-11-12 DIAGNOSIS — R5381 Other malaise: Secondary | ICD-10-CM | POA: Diagnosis not present

## 2022-11-12 DIAGNOSIS — R5383 Other fatigue: Secondary | ICD-10-CM

## 2022-11-12 DIAGNOSIS — F411 Generalized anxiety disorder: Secondary | ICD-10-CM

## 2022-11-12 MED ORDER — CLONAZEPAM 0.5 MG PO TABS: 0.5000 mg | ORAL_TABLET | Freq: Two times a day (BID) | ORAL | 2 refills | Status: DC | PRN

## 2022-11-12 MED ORDER — SUNOSI 150 MG PO TABS: ORAL_TABLET | ORAL | 2 refills | Status: DC

## 2022-11-12 NOTE — Progress Notes (Signed)
[facility]  Patient ID: Emma [name],  MRN: [id number]  PCP: Lin Landsman, MD  Date of Evaluation: 11/12/2022  Time spent:20 minutes  Chief Complaint:  Chief Complaint   Anxiety; Depression    HISTORY/CURRENT STATUS: HPI  For routine med check.  Doing really well. Has been taking care of her of great grandson who just turned a year old.  Does not work outside the home.  Patient is able to enjoy things.  She has a hard time with energy and motivation in the mornings.  She took Sunosi in the past and it did help, but made her feel sick if she took it every day.  She is asking if she can go back on that for just as needed.  No extreme sadness, tearfulness, or feelings of hopelessness.  ADLs and personal hygiene are normal.   Denies any changes in concentration, making decisions, or remembering things.  Appetite has not changed.  Weight is stable.  She does take the Klonopin on occasion for anxiety.  It is still very helpful.  She is still on a prescription that was filled in November.  Denies suicidal or homicidal thoughts.  Patient denies increased energy with decreased need for sleep, increased talkativeness, racing thoughts, impulsivity or risky behaviors, increased spending, increased libido, grandiosity, increased irritability or anger, paranoia, or hallucinations.  Denies dizziness, syncope, seizures, numbness, tingling, tremor, tics, unsteady gait, slurred speech, confusion. Denies muscle or joint pain, stiffness, or dystonia.  Individual Medical History/ Review of Systems: Changes? :No     Past medications for mental health diagnoses include: Cymbalta causes sweating/hot flashes above 40 mg, Prozac worked for [employer], Effexor caused her to be a zombie, Wellbutrin caused anger, Klonopin, Lunesta, Zoloft, Ambien, Trazodone, Trileptal caused severe nausea, Lamictal caused insomnia, Latuda caused h/a, had 'body heat not like hot flashes from menopause.' Sunosi too expensive,  Caplyta made her 'feel like she's smoked pot all day' after 1 pill.   Allergies: Latuda [lurasidone], Oxcarbazepine, and Codeine  Current Medications:  Current Outpatient Medications:    b complex vitamins capsule, Take 1 capsule by mouth daily., Disp: , Rfl:    cetirizine (ZYRTEC) 10 MG tablet, Take 10 mg by mouth daily., Disp: , Rfl:    DULoxetine (CYMBALTA) 20 MG capsule, Take 40 mg by mouth daily. , Disp: , Rfl:    estradiol (ESTRACE) 2 MG tablet, Take 2 mg by mouth daily. , Disp: , Rfl:    eszopiclone (LUNESTA) 2 MG TABS tablet, Take 1 tablet (2 mg total) by mouth at bedtime as needed for sleep. Take immediately before bedtime, Disp: 30 tablet, Rfl: 2   fluticasone (FLONASE) 50 MCG/ACT nasal spray, , Disp: , Rfl:    levothyroxine (SYNTHROID) 50 MCG tablet, Take 1 tablet (50 mcg total) by mouth daily before breakfast., Disp: 90 tablet, Rfl: 3   meloxicam (MOBIC) 15 MG tablet, Take 15 mg by mouth daily., Disp: , Rfl:    omeprazole (PRILOSEC) 40 MG capsule, Take 40 mg by mouth daily. , Disp: , Rfl:    Probiotic Product (PROBIOTIC-10 PO), Take by mouth., Disp: , Rfl:    promethazine (PHENERGAN) 25 MG tablet, Take 1 tablet (25 mg total) by mouth every 6 (six) hours as needed for nausea or vomiting., Disp: 30 tablet, Rfl: 0   Solriamfetol HCl (SUNOSI) 150 MG TABS, 1/2 po q am for 4 days, then may increase to 1 po q am., Disp: 30 tablet, Rfl: 2   tirzepatide (MOUNJARO) 2.5 MG/0.5ML Pen, Inject  2.5 mg into the skin once a week., Disp: , Rfl:    UNABLE TO FIND, CPAP, Disp: , Rfl:    vitamin E 1000 UNIT capsule, Take 1,000 Units by mouth daily., Disp: , Rfl:    clonazePAM (KLONOPIN) 0.5 MG tablet, Take 1 tablet (0.5 mg total) by mouth 2 (two) times daily as needed for anxiety., Disp: 30 tablet, Rfl: 2   losartan-hydrochlorothiazide (HYZAAR) 100-12.5 MG tablet, Take 1 tablet by mouth daily., Disp: 90 tablet, Rfl: 0   Omega-3 Fatty Acids (FISH OIL) 1000 MG CAPS, Take by mouth. (Patient not taking:  Reported on 12/04/2021), Disp: , Rfl:  Medication Side Effects: none  Family Medical/ Social History: Changes? none  MENTAL HEALTH EXAM:  There were no vitals taken for this visit.There is no height or weight on file to calculate BMI.  General Appearance: Casual, Well Groomed, and Obese  Eye Contact:  Good  Speech:  Clear and Coherent and Normal Rate  Volume:  Normal  Mood:  Euthymic  Affect:  Congruent  Thought Process:  Goal Directed and Descriptions of Associations: Circumstantial  Orientation:  Full (Time, Place, and Person)  Thought Content: Logical   Suicidal Thoughts:  No  Homicidal Thoughts:  No  Memory:  WNL  Judgement:  Good  Insight:  Good  Psychomotor Activity:  Normal  Concentration:  Concentration: Good and Attention Span: Good  Recall:  Good  Fund of Knowledge: Good  Language: Good  Assets:  Desire for Improvement Financial Resources/Insurance Housing Resilience  ADL's:  Intact  Cognition: WNL  Prognosis:  Good   GeneSight test results are on chart under media/scan.  Labs 10/13/2022 Hgb A1C 5.6 TSH 2.8  DIAGNOSES:    ICD-10-CM   1. Generalized anxiety disorder  F41.1     2. Malaise and fatigue  R53.81    R53.83       Receiving Psychotherapy: No   RECOMMENDATIONS:  PDMP reviewed.  Last Klonopin 07/16/2022.  Lunesta filled 03/28/2022.  Modafinil filled 10/17/2021.   I provided 20 minutes of face to face time during this encounter, including time spent before and after the visit in records review, medical decision making, counseling pertinent to today's visit, and charting.   Discussed getting back on Sunosi.  I gave her 1 pack of 75 mg samples and then 1 pack of 150 mg samples.  She will go online to get a co-pay card.  Benefits, risk and side effects were discussed and she accepts. Continues to refuse any medication specific for the bipolar disorder due to possible weight gain.  She is stable on current treatment so I will not push that.  Continue  Klonopin 0.5 mg 1 p.o. twice daily as needed. Continue Cymbalta 20 mg, 2 p.o. qd. (Sweats too much at higher doses) Continue Lunesta  2 mg hs prn. (I can rx from now on if she prefers) [employer] as noted above. Continue vitamins as per med sheet. Continue CPAP use. Return in 2 months.  Donnal Moat, PA-C

## 2022-11-18 ENCOUNTER — Telehealth: Payer: Self-pay

## 2022-11-18 NOTE — Telephone Encounter (Signed)
Prior Authorization submitted for Sunosi 75 mg with [employer], response received a denial pt must try and fail amphetamine based therapy or methylphenidate. They report that Modafinil and Armodafinil do not require a prior authorization.   Will inform Helene Kelp and follow up from there.

## 2022-11-18 NOTE — Telephone Encounter (Signed)
Ok, she's tried Modafinil and it wasn't helpful. Please see if she will try Armodafinil. Next choice would be Ritalin, Concerta, or Focalin, or Adderall, vyvanse, or Adzenys. I think the Armodafinil is best option for her, and I suppose it's more readily available???

## 2022-11-19 NOTE — Telephone Encounter (Signed)
Noted. I did not see where she tried Modafinil I must have overlooked that. I will check with her on Armodafinil and let you know.

## 2022-11-20 ENCOUNTER — Other Ambulatory Visit: Payer: Self-pay | Admitting: Physician Assistant

## 2022-11-20 MED ORDER — METHYLPHENIDATE HCL 10 MG PO TABS: 5.0000 mg | ORAL_TABLET | Freq: Two times a day (BID) | ORAL | 0 refills | Status: DC

## 2022-11-20 NOTE — Telephone Encounter (Signed)
By mistake, I didn't put it in my list of things she's tried, I'll add it next time. It was started 04/22/21, I documented in one of the visits that she forgot to take it, forgot she had it, re-started 12/04/2021. If she wants to retry it again, that's fine, or can start Armodafinil. Sorry for the confusion.

## 2022-11-20 NOTE — Telephone Encounter (Signed)
Left pt message to call back and discuss options

## 2022-11-20 NOTE — Telephone Encounter (Signed)
Spoke with pt and she prefers to try Ritalin and see how she does with that. She felt like the Armodafinil would be pretty comparable to the Modafinil she previously tried. She does not want Adderall.   Informed her I would let Helene Kelp know and she could send that to her [facility] pharmacy but it may need a PA as well.

## 2022-11-20 NOTE — Telephone Encounter (Signed)
Rx for Ritalin sent

## 2022-12-19 ENCOUNTER — Other Ambulatory Visit: Payer: Self-pay | Admitting: Gastroenterology

## 2022-12-19 DIAGNOSIS — R4702 Dysphasia: Secondary | ICD-10-CM

## 2022-12-23 ENCOUNTER — Other Ambulatory Visit (HOSPITAL_COMMUNITY): Payer: Self-pay | Admitting: Gastroenterology

## 2022-12-23 DIAGNOSIS — R4702 Dysphasia: Secondary | ICD-10-CM

## 2022-12-29 ENCOUNTER — Ambulatory Visit
Admission: RE | Admit: 2022-12-29 | Discharge: 2022-12-29 | Disposition: A | Discharge status: Home / Self Care | Source: Ambulatory Visit | Attending: Gastroenterology | Admitting: Gastroenterology

## 2022-12-29 ENCOUNTER — Other Ambulatory Visit (HOSPITAL_COMMUNITY): Payer: Self-pay | Admitting: Gastroenterology

## 2022-12-29 DIAGNOSIS — R4702 Dysphasia: Secondary | ICD-10-CM

## 2023-01-12 ENCOUNTER — Encounter: Payer: Self-pay | Admitting: Physician Assistant

## 2023-01-12 ENCOUNTER — Ambulatory Visit (INDEPENDENT_AMBULATORY_CARE_PROVIDER_SITE_OTHER): Admitting: Physician Assistant

## 2023-01-12 VITALS — BP 133/77 | HR 82

## 2023-01-12 DIAGNOSIS — F3130 Bipolar disorder, current episode depressed, mild or moderate severity, unspecified: Secondary | ICD-10-CM

## 2023-01-12 DIAGNOSIS — R5381 Other malaise: Secondary | ICD-10-CM | POA: Diagnosis not present

## 2023-01-12 DIAGNOSIS — F902 Attention-deficit hyperactivity disorder, combined type: Secondary | ICD-10-CM

## 2023-01-12 DIAGNOSIS — F411 Generalized anxiety disorder: Secondary | ICD-10-CM

## 2023-01-12 DIAGNOSIS — R5383 Other fatigue: Secondary | ICD-10-CM

## 2023-01-12 DIAGNOSIS — F5105 Insomnia due to other mental disorder: Secondary | ICD-10-CM

## 2023-01-12 DIAGNOSIS — F99 Mental disorder, not otherwise specified: Secondary | ICD-10-CM

## 2023-01-12 MED ORDER — AMPHETAMINE-DEXTROAMPHETAMINE 10 MG PO TABS: 5.0000 mg | ORAL_TABLET | Freq: Every day | ORAL | 0 refills | Status: DC

## 2023-01-12 NOTE — Progress Notes (Signed)
[facility]  Patient ID: Emma [name],  MRN: [id number]  PCP: Leilani Able, MD  Date of Evaluation: 01/12/2023  Time spent:30 minutes  Chief Complaint:  Chief Complaint   Anxiety; Depression; Insomnia; Follow-up; Fatigue    HISTORY/CURRENT STATUS: HPI  For routine med check.  Patient is able to enjoy things.  Energy and motivation are fair to good depending on the day.   No extreme sadness, tearfulness, or feelings of hopelessness.  Still has trouble sleeping but doesn't want to take the Lunesta b/c it makes her more tired the next day.  Insurance won't pay for [facility] which was very helpful.  ADLs and personal hygiene are normal.  Still has trouble focusing and memory isn't good, but no worse.  Appetite has not changed.  Weight is stable.  Anxiety is controlled, usually only takes Klonopin, 1/2 pill qd, depending on whether her husband or kids get on her nerves. She takes it in the evening if she takes it, only when her husband is about to come home.  Denies suicidal or homicidal thoughts.  Patient denies increased energy with decreased need for sleep, increased talkativeness, racing thoughts, impulsivity or risky behaviors, increased spending, increased libido, grandiosity, increased irritability or anger, paranoia, or hallucinations.  Denies dizziness, syncope, seizures, numbness, tingling, tremor, tics, unsteady gait, slurred speech, confusion. Denies muscle or joint pain, stiffness, or dystonia. Denies unexplained weight loss, frequent infections, or sores that heal slowly.  No polyphagia, polydipsia, or polyuria. Denies visual changes or paresthesias.   Individual Medical History/ Review of Systems: Changes? :No     Past medications for mental health diagnoses include: Cymbalta causes sweating/hot flashes above 40 mg, Prozac worked for [employer], Effexor caused her to be a zombie, Wellbutrin caused anger, Klonopin, Lunesta, Zoloft, Ambien, Trazodone, Trileptal caused severe  nausea, Lamictal caused insomnia, Latuda caused h/a, had 'body heat not like hot flashes from menopause.' Sunosi too expensive, Caplyta made her 'feel like she's smoked pot all day' after 1 pill. Ritalin caused fatigue, Modafinil, she tried Adderall many years ago w/o Rx.   Allergies: Latuda [lurasidone], Oxcarbazepine, and Codeine  Current Medications:  Current Outpatient Medications:    amphetamine-dextroamphetamine (ADDERALL) 10 MG tablet, Take 0.5-1 tablets (5-10 mg total) by mouth daily with breakfast., Disp: 30 tablet, Rfl: 0   b complex vitamins capsule, Take 1 capsule by mouth daily., Disp: , Rfl:    cetirizine (ZYRTEC) 10 MG tablet, Take 10 mg by mouth daily. prn, Disp: , Rfl:    clonazePAM (KLONOPIN) 0.5 MG tablet, Take 1 tablet (0.5 mg total) by mouth 2 (two) times daily as needed for anxiety., Disp: 30 tablet, Rfl: 2   DULoxetine (CYMBALTA) 20 MG capsule, Take 40 mg by mouth daily. , Disp: , Rfl:    estradiol (ESTRACE) 2 MG tablet, Take 2 mg by mouth daily. , Disp: , Rfl:    eszopiclone (LUNESTA) 2 MG TABS tablet, Take 1 tablet (2 mg total) by mouth at bedtime as needed for sleep. Take immediately before bedtime, Disp: 30 tablet, Rfl: 2   fluticasone (FLONASE) 50 MCG/ACT nasal spray, , Disp: , Rfl:    levothyroxine (SYNTHROID) 50 MCG tablet, Take 1 tablet (50 mcg total) by mouth daily before breakfast., Disp: 90 tablet, Rfl: 3   meloxicam (MOBIC) 15 MG tablet, Take 15 mg by mouth daily., Disp: , Rfl:    Omega-3 Fatty Acids (FISH OIL) 1000 MG CAPS, Take by mouth., Disp: , Rfl:    omeprazole (PRILOSEC) 40 MG capsule, Take 40  mg by mouth daily. , Disp: , Rfl:    Probiotic Product (PROBIOTIC-10 PO), Take by mouth., Disp: , Rfl:    promethazine (PHENERGAN) 25 MG tablet, Take 1 tablet (25 mg total) by mouth every 6 (six) hours as needed for nausea or vomiting., Disp: 30 tablet, Rfl: 0   tirzepatide (MOUNJARO) 2.5 MG/0.5ML Pen, Inject 2.5 mg into the skin once a week., Disp: , Rfl:     UNABLE TO FIND, CPAP, Disp: , Rfl:    vitamin E 1000 UNIT capsule, Take 1,000 Units by mouth daily., Disp: , Rfl:    losartan-hydrochlorothiazide (HYZAAR) 100-12.5 MG tablet, Take 1 tablet by mouth daily., Disp: 90 tablet, Rfl: 0   Solriamfetol HCl (SUNOSI) 150 MG TABS, 1/2 po q am for 4 days, then may increase to 1 po q am. (Patient not taking: Reported on 01/12/2023), Disp: 30 tablet, Rfl: 2 Medication Side Effects: none  Family Medical/ Social History: Changes? none  MENTAL HEALTH EXAM:  Blood pressure 133/77, pulse 82.There is no height or weight on file to calculate BMI.  General Appearance: Casual, Well Groomed, and Obese  Eye Contact:  Good  Speech:  Clear and Coherent and Normal Rate  Volume:  Normal  Mood:  Euthymic  Affect:  Congruent  Thought Process:  Goal Directed and Descriptions of Associations: Circumstantial  Orientation:  Full (Time, Place, and Person)  Thought Content: Logical   Suicidal Thoughts:  No  Homicidal Thoughts:  No  Memory:  WNL  Judgement:  Good  Insight:  Good  Psychomotor Activity:  Normal  Concentration:  Concentration: Good and Attention Span: Good  Recall:  Good  Fund of Knowledge: Good  Language: Good  Assets:  Desire for Improvement Financial Resources/Insurance Housing Resilience Transportation  ADL's:  Intact  Cognition: WNL  Prognosis:  Good   GeneSight test results are on chart under media/scan.  DIAGNOSES:    ICD-10-CM   1. Attention deficit hyperactivity disorder (ADHD), combined type  F90.2     2. Malaise and fatigue  R53.81    R53.83     3. Bipolar I disorder, most recent episode depressed (HCC)  F31.30     4. Generalized anxiety disorder  F41.1     5. Insomnia due to other mental disorder  F51.05    F99      Receiving Psychotherapy: No   RECOMMENDATIONS:  PDMP reviewed.  Last Klonopin 01/02/2023.  Ritalin filled 11/20/2022.  Lunesta filled 03/29/2023. I provided 30 minutes of face to face time during this encounter,  including time spent before and after the visit in records review, medical decision making, counseling pertinent to today's visit, and charting.   Discussed options for focus/attn and to help energy/motivation. Recommend trying Adderall. Benefits, risks and SE discussed. She understands there's a possibility of causing a manic episode, she knows what to watch for. Still refuses anything for BPD. Do not take with Klonopin.   Start Adderall to 10 mg, 1/2-1 q am.  Continue Klonopin 0.5 mg 1 p.o. twice daily as needed. Continue Cymbalta 20 mg, 2 p.o. qd. (Sweats too much at higher doses) Continue Lunesta  2 mg hs prn. (I can rx from now on if she prefers) Continue vitamins as per med sheet. Continue CPAP use. Return in 4 weeks.  Melony Overly, PA-C

## 2023-02-16 ENCOUNTER — Ambulatory Visit: Admitting: Physician Assistant

## 2023-02-23 ENCOUNTER — Ambulatory Visit: Admitting: Physician Assistant

## 2023-03-12 ENCOUNTER — Other Ambulatory Visit: Payer: Self-pay | Admitting: Family Medicine

## 2023-03-12 DIAGNOSIS — Z1231 Encounter for screening mammogram for malignant neoplasm of breast: Secondary | ICD-10-CM

## 2023-04-01 ENCOUNTER — Ambulatory Visit

## 2023-04-09 ENCOUNTER — Ambulatory Visit
Admission: RE | Admit: 2023-04-09 | Discharge: 2023-04-09 | Disposition: A | Discharge status: Home / Self Care | Source: Ambulatory Visit | Attending: Family Medicine | Admitting: Family Medicine

## 2023-04-09 DIAGNOSIS — Z1231 Encounter for screening mammogram for malignant neoplasm of breast: Secondary | ICD-10-CM

## 2023-04-16 ENCOUNTER — Encounter: Payer: Self-pay | Admitting: Physician Assistant

## 2023-04-16 ENCOUNTER — Ambulatory Visit (INDEPENDENT_AMBULATORY_CARE_PROVIDER_SITE_OTHER): Admitting: Physician Assistant

## 2023-04-16 DIAGNOSIS — F902 Attention-deficit hyperactivity disorder, combined type: Secondary | ICD-10-CM

## 2023-04-16 DIAGNOSIS — F411 Generalized anxiety disorder: Secondary | ICD-10-CM | POA: Diagnosis not present

## 2023-04-16 DIAGNOSIS — F99 Mental disorder, not otherwise specified: Secondary | ICD-10-CM | POA: Diagnosis not present

## 2023-04-16 DIAGNOSIS — F5105 Insomnia due to other mental disorder: Secondary | ICD-10-CM

## 2023-04-16 MED ORDER — AMPHETAMINE-DEXTROAMPHETAMINE 10 MG PO TABS: 5.0000 mg | ORAL_TABLET | Freq: Every day | ORAL | 0 refills | Status: DC

## 2023-04-16 MED ORDER — CLONAZEPAM 0.5 MG PO TABS: 0.5000 mg | ORAL_TABLET | Freq: Two times a day (BID) | ORAL | 2 refills | Status: DC | PRN

## 2023-04-16 MED ORDER — ESZOPICLONE 2 MG PO TABS: 2.0000 mg | ORAL_TABLET | Freq: Every evening | ORAL | 2 refills | Status: DC | PRN

## 2023-04-16 NOTE — Progress Notes (Signed)
[facility]  Patient ID: Emma [name],  MRN: [id number]  PCP: Leilani Able, MD  Date of Evaluation: 04/16/2023  Time spent:20 minutes  Chief Complaint:  Chief Complaint   ADD; Anxiety; Depression; Follow-up; Insomnia    HISTORY/CURRENT STATUS: HPI  For routine med check.  We started Adderall prn at LOV. It's been very helpful w/ energy and wakefulness. Sunosi worked very well. Ins won't pay though.   Patient is able to enjoy things.  Energy and motivation are good.   No extreme sadness, tearfulness, or feelings of hopelessness.  Sleeps well but has to take the Lunesta. ADLs and personal hygiene are normal.   Denies any changes in concentration, making decisions, or remembering things.  Appetite has not changed.  Weight is stable.  Anxiety is controlled, gets overwhelmed easily, takes the Klonopin prn and it helps.  Denies suicidal or homicidal thoughts.  Patient denies increased energy with decreased need for sleep, increased talkativeness, racing thoughts, impulsivity or risky behaviors, increased spending, increased libido, grandiosity, increased irritability or anger, paranoia, or hallucinations.  Denies dizziness, syncope, seizures, numbness, tingling, tremor, tics, unsteady gait, slurred speech, confusion. Denies muscle or joint pain, stiffness, or dystonia.  Individual Medical History/ Review of Systems: Changes? :Yes  had covid since LOV    Past medications for mental health diagnoses include: Cymbalta causes sweating/hot flashes above 40 mg, Prozac worked for [employer], Effexor caused her to be a zombie, Wellbutrin caused anger, Klonopin, Lunesta, Zoloft, Ambien, Trazodone, Trileptal caused severe nausea, Lamictal caused insomnia, Latuda caused h/a, had 'body heat not like hot flashes from menopause.' Sunosi too expensive, Caplyta made her 'feel like she's smoked pot all day' after 1 pill. Ritalin caused fatigue, Modafinil, she tried Adderall many years ago w/o Rx.    Allergies: Latuda [lurasidone], Oxcarbazepine, and Codeine  Current Medications:  Current Outpatient Medications:    b complex vitamins capsule, Take 1 capsule by mouth daily., Disp: , Rfl:    cetirizine (ZYRTEC) 10 MG tablet, Take 10 mg by mouth daily. prn, Disp: , Rfl:    DULoxetine (CYMBALTA) 20 MG capsule, Take 40 mg by mouth daily. , Disp: , Rfl:    estradiol (ESTRACE) 2 MG tablet, Take 2 mg by mouth daily. , Disp: , Rfl:    fluticasone (FLONASE) 50 MCG/ACT nasal spray, , Disp: , Rfl:    levothyroxine (SYNTHROID) 50 MCG tablet, Take 1 tablet (50 mcg total) by mouth daily before breakfast., Disp: 90 tablet, Rfl: 3   meloxicam (MOBIC) 15 MG tablet, Take 15 mg by mouth daily., Disp: , Rfl:    Omega-3 Fatty Acids (FISH OIL) 1000 MG CAPS, Take by mouth., Disp: , Rfl:    Probiotic Product (PROBIOTIC-10 PO), Take by mouth., Disp: , Rfl:    promethazine (PHENERGAN) 25 MG tablet, Take 1 tablet (25 mg total) by mouth every 6 (six) hours as needed for nausea or vomiting., Disp: 30 tablet, Rfl: 0   tirzepatide (MOUNJARO) 2.5 MG/0.5ML Pen, Inject 2.5 mg into the skin once a week., Disp: , Rfl:    vitamin E 1000 UNIT capsule, Take 1,000 Units by mouth daily., Disp: , Rfl:    amphetamine-dextroamphetamine (ADDERALL) 10 MG tablet, Take 0.5-1 tablets (5-10 mg total) by mouth daily with breakfast., Disp: 30 tablet, Rfl: 0   clonazePAM (KLONOPIN) 0.5 MG tablet, Take 1 tablet (0.5 mg total) by mouth 2 (two) times daily as needed for anxiety., Disp: 30 tablet, Rfl: 2   eszopiclone (LUNESTA) 2 MG TABS tablet, Take 1 tablet (  2 mg total) by mouth at bedtime as needed for sleep. Take immediately before bedtime, Disp: 30 tablet, Rfl: 2   losartan-hydrochlorothiazide (HYZAAR) 100-12.5 MG tablet, Take 1 tablet by mouth daily., Disp: 90 tablet, Rfl: 0   omeprazole (PRILOSEC) 40 MG capsule, Take 40 mg by mouth daily.  (Patient not taking: Reported on 04/16/2023), Disp: , Rfl:    UNABLE TO FIND, CPAP (Patient not  taking: Reported on 04/16/2023), Disp: , Rfl:  Medication Side Effects: none  Family Medical/ Social History: Changes? none  MENTAL HEALTH EXAM:  There were no vitals taken for this visit.There is no height or weight on file to calculate BMI.  General Appearance: Casual, Well Groomed, and Obese  Eye Contact:  Good  Speech:  Clear and Coherent and Normal Rate  Volume:  Normal  Mood:  Euthymic  Affect:  Congruent  Thought Process:  Goal Directed and Descriptions of Associations: Circumstantial  Orientation:  Full (Time, Place, and Person)  Thought Content: Logical   Suicidal Thoughts:  No  Homicidal Thoughts:  No  Memory:  WNL  Judgement:  Good  Insight:  Good  Psychomotor Activity:  Normal  Concentration:  Concentration: Good and Attention Span: Good  Recall:  Good  Fund of Knowledge: Good  Language: Good  Assets:  Communication Skills Desire for Improvement Financial Resources/Insurance Housing Resilience Transportation  ADL's:  Intact  Cognition: WNL  Prognosis:  Good   GeneSight test results are on chart under media/scan.  DIAGNOSES:    ICD-10-CM   1. Attention deficit hyperactivity disorder (ADHD), combined type  F90.2     2. Generalized anxiety disorder  F41.1     3. Insomnia due to other mental disorder  F51.05    F99      Receiving Psychotherapy: No   RECOMMENDATIONS:  PDMP reviewed.  Adderall filled 01/12/2023.  Last Klonopin 01/02/2023.  Ritalin filled 11/20/2022.  Lunesta filled 03/29/2023. I provided 20 minutes of face to face time during this encounter, including time spent before and after the visit in records review, medical decision making, counseling pertinent to today's visit, and charting.   She's doing well so no changes need to be made. There are no signs of abuse or diversion.   Continue  Adderall  10 mg, 1/2-1 q am.  Continue Klonopin 0.5 mg 1 p.o. twice daily as needed. Continue Cymbalta 20 mg, 2 p.o. qd. (Sweats too much at higher  doses) Continue Lunesta  2 mg hs prn. Continue vitamins as per med sheet. Continue CPAP use. Return in 3 months.  Melony Overly, PA-C

## 2023-07-20 ENCOUNTER — Ambulatory Visit (INDEPENDENT_AMBULATORY_CARE_PROVIDER_SITE_OTHER): Payer: Self-pay | Admitting: Physician Assistant

## 2023-07-20 DIAGNOSIS — Z91199 Patient's noncompliance with other medical treatment and regimen due to unspecified reason: Secondary | ICD-10-CM

## 2023-07-21 NOTE — Progress Notes (Signed)
No show

## 2023-08-04 ENCOUNTER — Ambulatory Visit: Admitting: Physician Assistant

## 2023-08-04 ENCOUNTER — Encounter: Payer: Self-pay | Admitting: Physician Assistant

## 2023-08-04 DIAGNOSIS — F5105 Insomnia due to other mental disorder: Secondary | ICD-10-CM

## 2023-08-04 DIAGNOSIS — F319 Bipolar disorder, unspecified: Secondary | ICD-10-CM

## 2023-08-04 DIAGNOSIS — F411 Generalized anxiety disorder: Secondary | ICD-10-CM | POA: Diagnosis not present

## 2023-08-04 DIAGNOSIS — F902 Attention-deficit hyperactivity disorder, combined type: Secondary | ICD-10-CM | POA: Diagnosis not present

## 2023-08-04 DIAGNOSIS — F99 Mental disorder, not otherwise specified: Secondary | ICD-10-CM

## 2023-08-04 MED ORDER — AMPHETAMINE-DEXTROAMPHETAMINE 10 MG PO TABS: 5.0000 mg | ORAL_TABLET | Freq: Every day | ORAL | 0 refills | Status: DC

## 2023-08-04 MED ORDER — DULOXETINE HCL 20 MG PO CPEP: 40.0000 mg | ORAL_CAPSULE | Freq: Every day | ORAL | 3 refills | Status: DC

## 2023-08-04 NOTE — Progress Notes (Signed)
[facility]  Patient ID: Emma [name],  MRN: [id number]  PCP: Leilani Able, MD  Date of Evaluation: 08/04/2023  Time spent:20 minutes  Chief Complaint:  Chief Complaint   Anxiety; Depression; ADD; Follow-up    HISTORY/CURRENT STATUS: HPI  For routine med check.  Has been a little depressed but hates this time of year.  Energy and motivation are good.   No extreme sadness, tearfulness, or feelings of hopelessness.  Sleeps a lot when she's sad.  ADLs and personal hygiene are normal.   Denies any changes in concentration, making decisions, or remembering things.  Appetite has not changed.  Weight is stable. Anxiety is controlled.  Needs the Xanax and it is effective.   Denies suicidal or homicidal thoughts.  Patient denies increased energy with decreased need for sleep, increased talkativeness, racing thoughts, impulsivity or risky behaviors, increased spending, increased libido, grandiosity, increased irritability or anger, paranoia, or hallucinations.  Denies dizziness, syncope, seizures, numbness, tingling, tremor, tics, unsteady gait, slurred speech, confusion. Denies muscle or joint pain, stiffness, or dystonia. Denies unexplained weight loss, frequent infections, or sores that heal slowly.  No polyphagia, polydipsia, or polyuria. Denies visual changes or paresthesias.    Individual Medical History/ Review of Systems: Changes? :No    Past medications for mental health diagnoses include: Cymbalta causes sweating/hot flashes above 40 mg, Prozac worked for [employer], Effexor caused her to be a zombie, Wellbutrin caused anger, Klonopin, Lunesta, Zoloft, Ambien, Trazodone, Trileptal caused severe nausea, Lamictal caused insomnia, Latuda caused h/a, had 'body heat not like hot flashes from menopause.' Sunosi too expensive, Caplyta made her 'feel like she's smoked pot all day' after 1 pill. Ritalin caused fatigue, Modafinil, she tried Adderall many years ago w/o Rx.   Allergies:  Latuda [lurasidone], Oxcarbazepine, and Codeine  Current Medications:  Current Outpatient Medications:    b complex vitamins capsule, Take 1 capsule by mouth daily., Disp: , Rfl:    cetirizine (ZYRTEC) 10 MG tablet, Take 10 mg by mouth daily. prn, Disp: , Rfl:    clonazePAM (KLONOPIN) 0.5 MG tablet, Take 1 tablet (0.5 mg total) by mouth 2 (two) times daily as needed for anxiety., Disp: 30 tablet, Rfl: 2   estradiol (ESTRACE) 2 MG tablet, Take 2 mg by mouth daily. , Disp: , Rfl:    eszopiclone (LUNESTA) 2 MG TABS tablet, Take 1 tablet (2 mg total) by mouth at bedtime as needed for sleep. Take immediately before bedtime, Disp: 30 tablet, Rfl: 2   fluticasone (FLONASE) 50 MCG/ACT nasal spray, , Disp: , Rfl:    levothyroxine (SYNTHROID) 50 MCG tablet, Take 1 tablet (50 mcg total) by mouth daily before breakfast., Disp: 90 tablet, Rfl: 3   meloxicam (MOBIC) 15 MG tablet, Take 15 mg by mouth daily., Disp: , Rfl:    Omega-3 Fatty Acids (FISH OIL) 1000 MG CAPS, Take by mouth., Disp: , Rfl:    omeprazole (PRILOSEC) 40 MG capsule, Take 40 mg by mouth daily., Disp: , Rfl:    Probiotic Product (PROBIOTIC-10 PO), Take by mouth., Disp: , Rfl:    promethazine (PHENERGAN) 25 MG tablet, Take 1 tablet (25 mg total) by mouth every 6 (six) hours as needed for nausea or vomiting., Disp: 30 tablet, Rfl: 0   tirzepatide (MOUNJARO) 2.5 MG/0.5ML Pen, Inject 2.5 mg into the skin once a week., Disp: , Rfl:    vitamin E 1000 UNIT capsule, Take 1,000 Units by mouth daily., Disp: , Rfl:    amphetamine-dextroamphetamine (ADDERALL) 10 MG tablet, Take  0.5-1 tablets (5-10 mg total) by mouth daily with breakfast., Disp: 30 tablet, Rfl: 0   DULoxetine (CYMBALTA) 20 MG capsule, Take 2 capsules (40 mg total) by mouth daily., Disp: 180 capsule, Rfl: 3   losartan-hydrochlorothiazide (HYZAAR) 100-12.5 MG tablet, Take 1 tablet by mouth daily., Disp: 90 tablet, Rfl: 0   UNABLE TO FIND, CPAP (Patient not taking: Reported on 04/16/2023),  Disp: , Rfl:  Medication Side Effects: none  Family Medical/ Social History: Changes? none  MENTAL HEALTH EXAM:  There were no vitals taken for this visit.There is no height or weight on file to calculate BMI.  General Appearance: Casual, Well Groomed, and Obese  Eye Contact:  Good  Speech:  Clear and Coherent and Normal Rate  Volume:  Normal  Mood:  Euthymic  Affect:  Congruent  Thought Process:  Goal Directed and Descriptions of Associations: Circumstantial  Orientation:  Full (Time, Place, and Person)  Thought Content: Logical   Suicidal Thoughts:  No  Homicidal Thoughts:  No  Memory:  WNL  Judgement:  Good  Insight:  Good  Psychomotor Activity:  Normal  Concentration:  Concentration: Good and Attention Span: Good  Recall:  Good  Fund of Knowledge: Good  Language: Good  Assets:  Communication Skills Desire for Improvement Financial Resources/Insurance Housing Resilience Transportation  ADL's:  Intact  Cognition: WNL  Prognosis:  Good   GeneSight test results are on chart under media/scan.  DIAGNOSES:    ICD-10-CM   1. Bipolar I disorder (HCC)  F31.9     2. Generalized anxiety disorder  F41.1     3. Insomnia due to other mental disorder  F51.05    F99     4. Attention deficit hyperactivity disorder (ADHD), combined type  F90.2       Receiving Psychotherapy: No   RECOMMENDATIONS:  PDMP reviewed.  Adderall filled 04/16/2023.  Last Klonopin 22 2024.  Ritalin filled 11/20/2022.  Lunesta filled 04/16/2023. I provided 20 minutes of face to face time during this encounter, including time spent before and after the visit in records review, medical decision making, counseling pertinent to today's visit, and charting.   Discussed the depression.  It seems more circumstantial, but she does not want to make any changes at this time.  Encouraged her to get out of bed, do things around the house.  Continue  Adderall  10 mg, 1/2-1 q am.  Continue Klonopin 0.5 mg 1 p.o.  twice daily as needed. Continue Cymbalta 20 mg, 2 p.o. qd. (Sweats too much at higher doses) Continue Lunesta  2 mg hs prn. (Rarely takes.)  Continue vitamins as per med sheet. Continue CPAP use. Return in 6-8 weeks.   Melony Overly, PA-C

## 2023-09-17 ENCOUNTER — Ambulatory Visit: Admitting: Physician Assistant

## 2023-11-24 ENCOUNTER — Other Ambulatory Visit: Payer: Self-pay | Admitting: Physician Assistant

## 2024-02-02 ENCOUNTER — Other Ambulatory Visit: Payer: Self-pay

## 2024-02-02 MED ORDER — DULOXETINE HCL 20 MG PO CPEP: 40.0000 mg | ORAL_CAPSULE | Freq: Every day | ORAL | 0 refills | Status: DC

## 2024-02-05 ENCOUNTER — Ambulatory Visit: Admitting: Physician Assistant

## 2024-02-05 ENCOUNTER — Encounter: Payer: Self-pay | Admitting: Physician Assistant

## 2024-02-05 DIAGNOSIS — F411 Generalized anxiety disorder: Secondary | ICD-10-CM

## 2024-02-05 DIAGNOSIS — F3130 Bipolar disorder, current episode depressed, mild or moderate severity, unspecified: Secondary | ICD-10-CM | POA: Diagnosis not present

## 2024-02-05 DIAGNOSIS — F99 Mental disorder, not otherwise specified: Secondary | ICD-10-CM

## 2024-02-05 DIAGNOSIS — F5105 Insomnia due to other mental disorder: Secondary | ICD-10-CM

## 2024-02-05 DIAGNOSIS — F902 Attention-deficit hyperactivity disorder, combined type: Secondary | ICD-10-CM | POA: Diagnosis not present

## 2024-02-05 DIAGNOSIS — G4733 Obstructive sleep apnea (adult) (pediatric): Secondary | ICD-10-CM

## 2024-02-05 MED ORDER — AMPHETAMINE-DEXTROAMPHETAMINE 10 MG PO TABS: 5.0000 mg | ORAL_TABLET | Freq: Every day | ORAL | 0 refills | Status: DC

## 2024-02-05 MED ORDER — ESZOPICLONE 2 MG PO TABS: 2.0000 mg | ORAL_TABLET | Freq: Every evening | ORAL | 2 refills | Status: DC | PRN

## 2024-02-05 MED ORDER — CLONAZEPAM 0.5 MG PO TABS: 0.5000 mg | ORAL_TABLET | Freq: Two times a day (BID) | ORAL | 2 refills | Status: AC | PRN

## 2024-02-05 NOTE — Progress Notes (Unsigned)
 [facility]  Patient ID: Emma [name],  MRN: [id number]  PCP: Danella Dunn, MD  Date of Evaluation: 02/05/2024  Time spent:20 minutes  Chief Complaint:  Chief Complaint   Anxiety; Depression; Follow-up; Insomnia    HISTORY/CURRENT STATUS: HPI  For routine med check.  For the most part, she'd doing well.  Energy and motivation are good, but there are days she would like to stay in bed.  No extreme sadness, tearfulness, or feelings of hopelessness.  Sleeps ok.  Occasionally takes Lunesta .  ADLs and personal hygiene are normal.   Memory is the same.   Appetite has not changed.  Weight is stable.  She still gets anxious, more so a sense of being overwhelmed than anything.  Feels on edge a lot but the Klonopin  is effective.  She does not usually have panic attacks.  She does not take it around the same time of Adderall.  Denies suicidal or homicidal thoughts.  States that attention is good without easy distractibility.  Able to focus on things and finish tasks to completion.   Patient denies increased energy with decreased need for sleep, increased talkativeness, racing thoughts, impulsivity or risky behaviors, increased spending, increased libido, grandiosity, increased irritability or anger, paranoia, or hallucinations.  Denies dizziness, syncope, seizures, numbness, tingling, tremor, tics, unsteady gait, slurred speech, confusion. Denies muscle or joint pain, stiffness, or dystonia.  Individual Medical History/ Review of Systems: Changes? :Yes   had a fall on an escalator since the LOV.  Injured her low back/ buttock, sore but not preventing her from doing anything. It's gradually improving.   Past medications for mental health diagnoses include: Cymbalta  causes sweating/hot flashes above 40 mg, Prozac worked for [employer], Effexor caused her to be a zombie, Wellbutrin caused anger, Klonopin , Lunesta , Zoloft, Ambien, Trazodone, Trileptal  caused severe nausea, Lamictal  caused  insomnia, Latuda  caused h/a, had 'body heat not like hot flashes from menopause.' Sunosi  too expensive, Caplyta  made her 'feel like she's smoked pot all day' after 1 pill. Ritalin  caused fatigue, Modafinil , she tried Adderall many years ago w/o Rx.   Allergies: Latuda  [lurasidone ], Oxcarbazepine , and Codeine  Current Medications:  Current Outpatient Medications:    [START ON 03/05/2024] amphetamine -dextroamphetamine  (ADDERALL) 10 MG tablet, Take 0.5-1 tablets (5-10 mg total) by mouth daily with breakfast., Disp: 30 tablet, Rfl: 0   amphetamine -dextroamphetamine  (ADDERALL) 10 MG tablet, Take 0.5-1 tablets (5-10 mg total) by mouth daily with breakfast., Disp: 30 tablet, Rfl: 0   b complex vitamins capsule, Take 1 capsule by mouth daily., Disp: , Rfl:    cetirizine (ZYRTEC) 10 MG tablet, Take 10 mg by mouth daily. prn, Disp: , Rfl:    DULoxetine  (CYMBALTA ) 20 MG capsule, Take 2 capsules (40 mg total) by mouth daily., Disp: 180 capsule, Rfl: 0   estradiol (ESTRACE) 2 MG tablet, Take 2 mg by mouth daily. , Disp: , Rfl:    fluticasone (FLONASE) 50 MCG/ACT nasal spray, , Disp: , Rfl:    levothyroxine  (SYNTHROID ) 50 MCG tablet, Take 1 tablet (50 mcg total) by mouth daily before breakfast., Disp: 90 tablet, Rfl: 3   meloxicam (MOBIC) 15 MG tablet, Take 15 mg by mouth daily., Disp: , Rfl:    Omega-3 Fatty Acids (FISH OIL) 1000 MG CAPS, Take by mouth., Disp: , Rfl:    omeprazole (PRILOSEC) 40 MG capsule, Take 40 mg by mouth daily., Disp: , Rfl:    Probiotic Product (PROBIOTIC-10 PO), Take by mouth., Disp: , Rfl:    promethazine  (PHENERGAN ) 25 MG  tablet, Take 1 tablet (25 mg total) by mouth every 6 (six) hours as needed for nausea or vomiting., Disp: 30 tablet, Rfl: 0   tirzepatide (MOUNJARO) 2.5 MG/0.5ML Pen, Inject 2.5 mg into the skin once a week., Disp: , Rfl:    vitamin E 1000 UNIT capsule, Take 1,000 Units by mouth daily., Disp: , Rfl:    [START ON 04/04/2024] amphetamine -dextroamphetamine  (ADDERALL) 10  MG tablet, Take 0.5-1 tablets (5-10 mg total) by mouth daily with breakfast., Disp: 30 tablet, Rfl: 0   clonazePAM  (KLONOPIN ) 0.5 MG tablet, Take 1 tablet (0.5 mg total) by mouth 2 (two) times daily as needed for anxiety., Disp: 30 tablet, Rfl: 2   eszopiclone  (LUNESTA ) 2 MG TABS tablet, Take 1 tablet (2 mg total) by mouth at bedtime as needed for sleep. Take immediately before bedtime, Disp: 30 tablet, Rfl: 2   losartan -hydrochlorothiazide (HYZAAR) 100-12.5 MG tablet, Take 1 tablet by mouth daily., Disp: 90 tablet, Rfl: 0   UNABLE TO FIND, CPAP (Patient not taking: Reported on 04/16/2023), Disp: , Rfl:  Medication Side Effects: none  Family Medical/ Social History: Changes? none  MENTAL HEALTH EXAM:  There were no vitals taken for this visit.There is no height or weight on file to calculate BMI.  General Appearance: Casual, Well Groomed, and Obese  Eye Contact:  Good  Speech:  Clear and Coherent and Normal Rate  Volume:  Normal  Mood:  Euthymic  Affect:  Congruent  Thought Process:  Goal Directed and Descriptions of Associations: Circumstantial  Orientation:  Full (Time, Place, and Person)  Thought Content: Logical   Suicidal Thoughts:  No  Homicidal Thoughts:  No  Memory:  WNL  Judgement:  Good  Insight:  Good  Psychomotor Activity:  Normal  Concentration:  Concentration: Good and Attention Span: Good  Recall:  Good  Fund of Knowledge: Good  Language: Good  Assets:  Communication Skills Desire for Improvement Financial Resources/Insurance Housing Resilience Transportation  ADL's:  Intact  Cognition: WNL  Prognosis:  Good   GeneSight test results are on chart under media/scan.  DIAGNOSES:    ICD-10-CM   1. Bipolar I disorder, most recent episode depressed (HCC)  F31.30     2. Generalized anxiety disorder  F41.1     3. Insomnia due to other mental disorder  F51.05    F99     4. Attention deficit hyperactivity disorder (ADHD), combined type  F90.2     5.  Obstructive sleep apnea  G47.33       Receiving Psychotherapy: No   RECOMMENDATIONS:  PDMP reviewed.  Adderall filled 08/04/2023.  Last Klonopin  04/16/2023.  Lunesta  filled 11/30/2023. I provided approximately 20 minutes of face to face time during this encounter, including time spent before and after the visit in records review, medical decision making, counseling pertinent to today's visit, and charting.   She is doing well on the current treatment so no changes will be made.  Over the years she has taken several mood stabilizers and antipsychotics.  She was not able to tolerate them and does not feel the need to go back on a drug and either of those classes.  Continue  Adderall  10 mg, 1/2-1 q am.  Continue Klonopin  0.5 mg 1 p.o. twice daily as needed.  Take as sparingly as possible. Continue Cymbalta  20 mg, 2 p.o. qd. (Sweats too much at higher doses) Continue Lunesta   2 mg hs prn. (Rarely takes.)  Continue vitamins as per med sheet. Continue CPAP use. Return  in 3 months.  Marvia Slocumb, PA-C

## 2024-02-24 ENCOUNTER — Other Ambulatory Visit: Payer: Self-pay | Admitting: Otolaryngology

## 2024-03-03 ENCOUNTER — Ambulatory Visit (HOSPITAL_COMMUNITY): Admitting: Anesthesiology

## 2024-03-03 ENCOUNTER — Encounter (HOSPITAL_COMMUNITY)
Admission: RE | Disposition: A | Discharge status: Home / Self Care | Payer: Self-pay | Source: Home / Self Care | Attending: Otolaryngology

## 2024-03-03 ENCOUNTER — Ambulatory Visit (HOSPITAL_COMMUNITY)
Admission: RE | Admit: 2024-03-03 | Discharge: 2024-03-03 | Disposition: A | Discharge status: Home / Self Care | Attending: Otolaryngology | Admitting: Otolaryngology

## 2024-03-03 ENCOUNTER — Encounter (HOSPITAL_COMMUNITY): Payer: Self-pay | Admitting: Otolaryngology

## 2024-03-03 DIAGNOSIS — Z79890 Hormone replacement therapy (postmenopausal): Secondary | ICD-10-CM | POA: Insufficient documentation

## 2024-03-03 DIAGNOSIS — I10 Essential (primary) hypertension: Secondary | ICD-10-CM | POA: Diagnosis not present

## 2024-03-03 DIAGNOSIS — Z79899 Other long term (current) drug therapy: Secondary | ICD-10-CM | POA: Diagnosis not present

## 2024-03-03 DIAGNOSIS — Z6827 Body mass index (BMI) 27.0-27.9, adult: Secondary | ICD-10-CM | POA: Insufficient documentation

## 2024-03-03 DIAGNOSIS — J45909 Unspecified asthma, uncomplicated: Secondary | ICD-10-CM | POA: Diagnosis not present

## 2024-03-03 DIAGNOSIS — E039 Hypothyroidism, unspecified: Secondary | ICD-10-CM | POA: Insufficient documentation

## 2024-03-03 DIAGNOSIS — Z87891 Personal history of nicotine dependence: Secondary | ICD-10-CM | POA: Insufficient documentation

## 2024-03-03 DIAGNOSIS — G4733 Obstructive sleep apnea (adult) (pediatric): Secondary | ICD-10-CM | POA: Diagnosis present

## 2024-03-03 HISTORY — PX: DRUG INDUCED ENDOSCOPY: SHX6808

## 2024-03-03 SURGERY — DRUG INDUCED SLEEP ENDOSCOPY
Anesthesia: Monitor Anesthesia Care | Laterality: Bilateral

## 2024-03-03 MED ORDER — PROPOFOL 10 MG/ML IV BOLUS
INTRAVENOUS | Status: DC | PRN
  Administered 2024-03-03: 200 mg via INTRAVENOUS

## 2024-03-03 MED ORDER — SODIUM CHLORIDE 0.9 % IV SOLN: INTRAVENOUS | Status: DC | PRN

## 2024-03-03 MED ORDER — OXYMETAZOLINE HCL 0.05 % NA SOLN
NASAL | Status: AC
Start: 2024-03-03 — End: 2024-03-03
  Filled 2024-03-03: qty 30

## 2024-03-03 MED ORDER — OXYMETAZOLINE HCL 0.05 % NA SOLN
NASAL | Status: DC | PRN
  Administered 2024-03-03: 1

## 2024-03-03 MED ORDER — LIDOCAINE HCL (CARDIAC) PF 100 MG/5ML IV SOSY
PREFILLED_SYRINGE | INTRAVENOUS | Status: DC | PRN
  Administered 2024-03-03: 100 mg via INTRATRACHEAL

## 2024-03-03 NOTE — Anesthesia Preprocedure Evaluation (Addendum)
 Anesthesia Evaluation  Patient identified by MRN, date of birth, ID band Patient awake    Reviewed: Allergy & Precautions, H&P , NPO status , Patient's Chart, lab work & pertinent test results  History of Anesthesia Complications (+) PONV and history of anesthetic complications  Airway Mallampati: III  TM Distance: >3 FB Neck ROM: Full    Dental no notable dental hx. (+) Teeth Intact, Dental Advisory Given   Pulmonary asthma , sleep apnea , former smoker   Pulmonary exam normal breath sounds clear to auscultation       Cardiovascular hypertension, Pt. on medications  Rhythm:Regular Rate:Normal     Neuro/Psych   Anxiety  Bipolar Disorder   negative neurological ROS     GI/Hepatic Neg liver ROS,GERD  Medicated,,  Endo/Other  Hypothyroidism    Renal/GU negative Renal ROS  negative genitourinary   Musculoskeletal   Abdominal   Peds  Hematology negative hematology ROS (+)   Anesthesia Other Findings   Reproductive/Obstetrics negative OB ROS                              Anesthesia Physical Anesthesia Plan  ASA: 3  Anesthesia Plan: MAC   Post-op Pain Management: Minimal or no pain anticipated   Induction: Intravenous  PONV Risk Score and Plan: 3 and Propofol  infusion and Treatment may vary due to age or medical condition  Airway Management Planned: Natural Airway and Simple Face Mask  Additional Equipment:   Intra-op Plan:   Post-operative Plan:   Informed Consent: I have reviewed the patients History and Physical, chart, labs and discussed the procedure including the risks, benefits and alternatives for the proposed anesthesia with the patient or authorized representative who has indicated his/her understanding and acceptance.     Dental advisory given  Plan Discussed with: CRNA  Anesthesia Plan Comments:          Anesthesia Quick Evaluation

## 2024-03-03 NOTE — H&P (Signed)
 Emma [name] is an 63 y.o. female.   Chief Complaint: Sleep apnea HPI: 63 year old female with obstructive sleep apnea who has not been able to tolerate CPAP.  Past Medical History:  Diagnosis Date   Anxiety    Asthma    GERD (gastroesophageal reflux disease)    Hypertension    PONV (postoperative nausea and vomiting)    Sleep apnea    non compliant with CPAP   Thyroid  disease     Past Surgical History:  Procedure Laterality Date   ABDOMINAL HYSTERECTOMY     CHOLECYSTECTOMY     JOINT REPLACEMENT     SHOULDER ARTHROSCOPY WITH SUBACROMIAL DECOMPRESSION, ROTATOR CUFF REPAIR AND BICEP TENDON REPAIR Left 04/01/2019   Procedure: LEFT SHOULDER ARTHROSCOPY WITH SUBACROMIAL DECOMPRESSION, DISTAL CLAVICLE EXCISION, BICEP TENODESIS, ROTATOR CUFF REPAIR, spinal needle decrompression of glenoid cyst;  Surgeon: Beverley Evalene BIRCH, MD;  Location: [facility];  Service: Orthopedics;  Laterality: Left;    Family History  Problem Relation Age of Onset   Breast cancer Mother 50   Dementia Mother    Drug abuse Mother    Breast cancer Sister 13   Stomach cancer Father    Heart disease Father    Obesity Brother    Breast cancer Paternal Grandmother    Social History:  reports that she quit smoking about 14 years ago. Her smoking use included cigarettes. She has never used smokeless tobacco. She reports current alcohol use. She reports that she does not use drugs.  Allergies:  Allergies  Allergen Reactions   Latuda  [Lurasidone ] Other (See Comments)    Closed her throat   Oxcarbazepine  Nausea Only   Codeine Nausea Only    Medications Prior to Admission  Medication Sig Dispense Refill   [START ON 04/04/2024] amphetamine -dextroamphetamine  (ADDERALL) 10 MG tablet Take 0.5-1 tablets (5-10 mg total) by mouth daily with breakfast. 30 tablet 0   cetirizine (ZYRTEC) 10 MG tablet Take 10 mg by mouth daily. prn     clonazePAM  (KLONOPIN ) 0.5 MG tablet Take 1 tablet (0.5 mg total) by mouth 2  (two) times daily as needed for anxiety. 30 tablet 2   DULoxetine  (CYMBALTA ) 20 MG capsule Take 2 capsules (40 mg total) by mouth daily. 180 capsule 0   estradiol (ESTRACE) 2 MG tablet Take 2 mg by mouth daily.      eszopiclone  (LUNESTA ) 2 MG TABS tablet Take 1 tablet (2 mg total) by mouth at bedtime as needed for sleep. Take immediately before bedtime 30 tablet 2   fluticasone (FLONASE) 50 MCG/ACT nasal spray      levothyroxine  (SYNTHROID ) 50 MCG tablet Take 1 tablet (50 mcg total) by mouth daily before breakfast. 90 tablet 3   losartan -hydrochlorothiazide (HYZAAR) 100-12.5 MG tablet Take 1 tablet by mouth daily. 90 tablet 0   meloxicam (MOBIC) 15 MG tablet Take 15 mg by mouth daily.     omeprazole (PRILOSEC) 40 MG capsule Take 40 mg by mouth daily.     Probiotic Product (PROBIOTIC-10 PO) Take by mouth.     tirzepatide (MOUNJARO) 2.5 MG/0.5ML Pen Inject 2.5 mg into the skin once a week.     UNABLE TO FIND CPAP     vitamin E 1000 UNIT capsule Take 1,000 Units by mouth daily.     [START ON 03/05/2024] amphetamine -dextroamphetamine  (ADDERALL) 10 MG tablet Take 0.5-1 tablets (5-10 mg total) by mouth daily with breakfast. 30 tablet 0   amphetamine -dextroamphetamine  (ADDERALL) 10 MG tablet Take 0.5-1 tablets (5-10 mg total)  by mouth daily with breakfast. 30 tablet 0   b complex vitamins capsule Take 1 capsule by mouth daily.     Omega-3 Fatty Acids (FISH OIL) 1000 MG CAPS Take by mouth.     promethazine  (PHENERGAN ) 25 MG tablet Take 1 tablet (25 mg total) by mouth every 6 (six) hours as needed for nausea or vomiting. 30 tablet 0    No results found for this or any previous visit (from the past 48 hours). No results found.  Review of Systems  All other systems reviewed and are negative.   Blood pressure 139/80, pulse 78, temperature (!) 96.2 F (35.7 C), temperature source Tympanic, resp. rate 20, height 5' 2 (1.575 m), weight 68.5 kg, SpO2 98%. Physical Exam Constitutional:      Appearance:  Normal appearance. She is normal weight.  HENT:     Head: Normocephalic and atraumatic.     Right Ear: External ear normal.     Left Ear: External ear normal.     Nose: Nose normal.     Mouth/Throat:     Mouth: Mucous membranes are moist.     Pharynx: Oropharynx is clear.  Eyes:     Extraocular Movements: Extraocular movements intact.     Pupils: Pupils are equal, round, and reactive to light.  Cardiovascular:     Rate and Rhythm: Normal rate.  Pulmonary:     Effort: Pulmonary effort is normal.  Skin:    General: Skin is warm and dry.  Neurological:     General: No focal deficit present.     Mental Status: She is alert and oriented to person, place, and time.  Psychiatric:        Mood and Affect: Mood normal.        Behavior: Behavior normal.        Thought Content: Thought content normal.        Judgment: Judgment normal.      Assessment/Plan Obstructive sleep apnea and BMI 27.62  To OR for sleep endoscopy.  Vaughan Ricker, MD 03/03/2024, 12:14 PM

## 2024-03-03 NOTE — Transfer of Care (Signed)
 Immediate Anesthesia Transfer of Care Note  Patient: Emma [name]  Procedure(s) Performed: DRUG INDUCED SLEEP ENDOSCOPY (Bilateral)  Patient Location: Endoscopy Unit  Anesthesia Type:MAC  Level of Consciousness: awake, alert , oriented, and patient cooperative  Airway & Oxygen Therapy: Patient Spontanous Breathing  Post-op Assessment: Report given to RN, Post -op Vital signs reviewed and stable, Patient moving all extremities X 4, and Patient able to stick tongue midline  Post vital signs: Reviewed and stable  Last Vitals:  Vitals Value Taken Time  BP 115/54 1253  Temp 97.8 1253  Pulse 78 1253  Resp 20 1253  SpO2 98 1253    Last Pain:  Vitals:   03/03/24 1127  TempSrc: Tympanic  PainSc: 0-No pain         Complications: No notable events documented.

## 2024-03-03 NOTE — Op Note (Signed)
 Preop diagnosis: Obstructive sleep apnea Postop diagnosis: same Procedure: Drug-induced sleep endoscopy Surgeon: Carlie Anesth: IV sedation Compl: None Findings: There is 50% anterior-posterior collapse at the velum making her a candidate for hypoglossal nerve stimulator placement.  There was also 50% anterior-posterior collapse at the tongue base with some sidewall collapse as well. Description:  After discussing risks, benefits, and alternatives, the patient was brought to the operative suite and placed on the operative table in the supine position.  Anesthesia was induced and the patient was given light sedation to simulate natural sleep. When the proper level was reached, an Afrin-soaked pledget was placed in the right nasal passage for a couple of minutes and then removed.  The fiberoptic laryngoscope was then passed to view the pharynx and larynx.  Findings are noted above and the exam was recorded.  After completion, the scope was removed and the patient was returned to anesthesia for wakeup and was moved to the recovery room in stable condition.

## 2024-03-03 NOTE — Anesthesia Procedure Notes (Signed)
 Procedure Name: MAC Date/Time: 03/03/2024 12:37 PM  Performed by: Viviana Almarie DASEN, CRNAPre-anesthesia Checklist: Patient identified, Emergency Drugs available, Suction available, Patient being monitored and Timeout performed Patient Re-evaluated:Patient Re-evaluated prior to induction Oxygen Delivery Method: Nasal cannula Induction Type: IV induction

## 2024-03-03 NOTE — Anesthesia Postprocedure Evaluation (Signed)
 Anesthesia Post Note  Patient: Nena Hampe  Procedure(s) Performed: DRUG INDUCED SLEEP ENDOSCOPY (Bilateral)     Patient location during evaluation: Endoscopy Anesthesia Type: MAC Level of consciousness: awake and alert Pain management: pain level controlled Vital Signs Assessment: post-procedure vital signs reviewed and stable Respiratory status: spontaneous breathing, nonlabored ventilation and respiratory function stable Cardiovascular status: stable and blood pressure returned to baseline Postop Assessment: no apparent nausea or vomiting Anesthetic complications: no   No notable events documented.  Last Vitals:  Vitals:   03/03/24 1300 03/03/24 1310  BP: 116/72 131/76  Pulse: 85 80  Resp: (!) 21 15  Temp:    SpO2: 96% 97%    Last Pain:  Vitals:   03/03/24 1310  TempSrc:   PainSc: 0-No pain                 Cartez Mogle,W. EDMOND

## 2024-03-22 ENCOUNTER — Other Ambulatory Visit: Payer: Self-pay | Admitting: Nurse Practitioner

## 2024-03-22 ENCOUNTER — Other Ambulatory Visit: Payer: Self-pay | Admitting: Family Medicine

## 2024-03-22 DIAGNOSIS — Z1231 Encounter for screening mammogram for malignant neoplasm of breast: Secondary | ICD-10-CM

## 2024-04-08 ENCOUNTER — Other Ambulatory Visit: Payer: Self-pay | Admitting: Otolaryngology

## 2024-04-11 HISTORY — PX: OTHER SURGICAL HISTORY: SHX169

## 2024-04-14 ENCOUNTER — Encounter (HOSPITAL_BASED_OUTPATIENT_CLINIC_OR_DEPARTMENT_OTHER): Payer: Self-pay | Admitting: Otolaryngology

## 2024-04-14 ENCOUNTER — Ambulatory Visit (INDEPENDENT_AMBULATORY_CARE_PROVIDER_SITE_OTHER)

## 2024-04-14 ENCOUNTER — Ambulatory Visit

## 2024-04-14 ENCOUNTER — Other Ambulatory Visit: Payer: Self-pay

## 2024-04-14 DIAGNOSIS — Z1231 Encounter for screening mammogram for malignant neoplasm of breast: Secondary | ICD-10-CM

## 2024-04-21 ENCOUNTER — Encounter (HOSPITAL_BASED_OUTPATIENT_CLINIC_OR_DEPARTMENT_OTHER): Payer: Self-pay | Admitting: Otolaryngology

## 2024-04-21 ENCOUNTER — Ambulatory Visit (HOSPITAL_BASED_OUTPATIENT_CLINIC_OR_DEPARTMENT_OTHER): Admitting: Anesthesiology

## 2024-04-21 ENCOUNTER — Other Ambulatory Visit: Payer: Self-pay

## 2024-04-21 ENCOUNTER — Encounter (HOSPITAL_BASED_OUTPATIENT_CLINIC_OR_DEPARTMENT_OTHER)
Admission: RE | Disposition: A | Discharge status: Home / Self Care | Payer: Self-pay | Source: Home / Self Care | Attending: Otolaryngology

## 2024-04-21 ENCOUNTER — Ambulatory Visit (HOSPITAL_BASED_OUTPATIENT_CLINIC_OR_DEPARTMENT_OTHER)
Admission: RE | Admit: 2024-04-21 | Discharge: 2024-04-21 | Disposition: A | Discharge status: Home / Self Care | Attending: Otolaryngology | Admitting: Otolaryngology

## 2024-04-21 ENCOUNTER — Ambulatory Visit (HOSPITAL_COMMUNITY)

## 2024-04-21 DIAGNOSIS — E039 Hypothyroidism, unspecified: Secondary | ICD-10-CM | POA: Insufficient documentation

## 2024-04-21 DIAGNOSIS — I10 Essential (primary) hypertension: Secondary | ICD-10-CM | POA: Diagnosis not present

## 2024-04-21 DIAGNOSIS — Z87891 Personal history of nicotine dependence: Secondary | ICD-10-CM | POA: Diagnosis not present

## 2024-04-21 DIAGNOSIS — E669 Obesity, unspecified: Secondary | ICD-10-CM | POA: Diagnosis not present

## 2024-04-21 DIAGNOSIS — G4733 Obstructive sleep apnea (adult) (pediatric): Secondary | ICD-10-CM | POA: Insufficient documentation

## 2024-04-21 DIAGNOSIS — J45909 Unspecified asthma, uncomplicated: Secondary | ICD-10-CM | POA: Insufficient documentation

## 2024-04-21 DIAGNOSIS — Z6830 Body mass index (BMI) 30.0-30.9, adult: Secondary | ICD-10-CM | POA: Diagnosis not present

## 2024-04-21 HISTORY — PX: IMPLANTATION OF HYPOGLOSSAL NERVE STIMULATOR: SHX6827

## 2024-04-21 HISTORY — PX: OTHER SURGICAL HISTORY: SHX169

## 2024-04-21 SURGERY — INSERTION, HYPOGLOSSAL NERVE STIMULATOR
Anesthesia: General | Site: Chest | Laterality: Right

## 2024-04-21 MED ORDER — ONDANSETRON HCL 4 MG/2ML IJ SOLN
INTRAMUSCULAR | Status: AC
  Filled 2024-04-21: qty 2

## 2024-04-21 MED ORDER — PROPOFOL 500 MG/50ML IV EMUL
INTRAVENOUS | Status: AC
  Filled 2024-04-21: qty 100

## 2024-04-21 MED ORDER — ONDANSETRON HCL 4 MG/2ML IJ SOLN
INTRAMUSCULAR | Status: DC | PRN
  Administered 2024-04-21: 4 mg via INTRAVENOUS

## 2024-04-21 MED ORDER — PHENYLEPHRINE HCL-NACL 20-0.9 MG/250ML-% IV SOLN
INTRAVENOUS | Status: DC | PRN
  Administered 2024-04-21: 20 ug/min via INTRAVENOUS

## 2024-04-21 MED ORDER — 0.9 % SODIUM CHLORIDE (POUR BTL) OPTIME
TOPICAL | Status: DC | PRN
  Administered 2024-04-21: 120 mL

## 2024-04-21 MED ORDER — MIDAZOLAM HCL 5 MG/5ML IJ SOLN
INTRAMUSCULAR | Status: DC | PRN
  Administered 2024-04-21: 2 mg via INTRAVENOUS

## 2024-04-21 MED ORDER — ONDANSETRON HCL 4 MG/2ML IJ SOLN: 4.0000 mg | Freq: Once | INTRAMUSCULAR | Status: DC | PRN

## 2024-04-21 MED ORDER — DEXAMETHASONE SODIUM PHOSPHATE 10 MG/ML IJ SOLN
INTRAMUSCULAR | Status: DC | PRN
  Administered 2024-04-21: 10 mg via INTRAVENOUS

## 2024-04-21 MED ORDER — DEXMEDETOMIDINE HCL IN NACL 80 MCG/20ML IV SOLN
INTRAVENOUS | Status: AC
  Filled 2024-04-21: qty 20

## 2024-04-21 MED ORDER — MIDAZOLAM HCL 2 MG/2ML IJ SOLN
INTRAMUSCULAR | Status: AC
Start: 2024-04-21 — End: 2024-04-21
  Filled 2024-04-21: qty 2

## 2024-04-21 MED ORDER — CEFAZOLIN SODIUM-DEXTROSE 2-3 GM-%(50ML) IV SOLR
INTRAVENOUS | Status: DC | PRN
  Administered 2024-04-21: 2 g via INTRAVENOUS

## 2024-04-21 MED ORDER — PHENYLEPHRINE HCL-NACL 20-0.9 MG/250ML-% IV SOLN
INTRAVENOUS | Status: AC
  Filled 2024-04-21: qty 250

## 2024-04-21 MED ORDER — HYDROMORPHONE HCL 1 MG/ML IJ SOLN
0.2500 mg | INTRAMUSCULAR | Status: DC | PRN
  Administered 2024-04-21 (×2): 0.25 mg via INTRAVENOUS

## 2024-04-21 MED ORDER — LACTATED RINGERS IV SOLN: INTRAVENOUS | Status: DC | PRN

## 2024-04-21 MED ORDER — LABETALOL HCL 5 MG/ML IV SOLN
INTRAVENOUS | Status: DC | PRN
Start: 2024-04-21 — End: 2024-04-21
  Administered 2024-04-21: 10 mg via INTRAVENOUS

## 2024-04-21 MED ORDER — SCOPOLAMINE 1 MG/3DAYS TD PT72
1.0000 | MEDICATED_PATCH | TRANSDERMAL | Status: DC
  Administered 2024-04-21: 1 mg via TRANSDERMAL

## 2024-04-21 MED ORDER — LIDOCAINE 2% (20 MG/ML) 5 ML SYRINGE
INTRAMUSCULAR | Status: DC | PRN
  Administered 2024-04-21: 60 mg via INTRAVENOUS

## 2024-04-21 MED ORDER — FENTANYL CITRATE (PF) 100 MCG/2ML IJ SOLN
INTRAMUSCULAR | Status: DC | PRN
  Administered 2024-04-21 (×2): 50 ug via INTRAVENOUS

## 2024-04-21 MED ORDER — DROPERIDOL 2.5 MG/ML IJ SOLN
INTRAMUSCULAR | Status: AC
  Filled 2024-04-21: qty 2

## 2024-04-21 MED ORDER — DEXAMETHASONE SODIUM PHOSPHATE 10 MG/ML IJ SOLN
INTRAMUSCULAR | Status: AC
  Filled 2024-04-21: qty 1

## 2024-04-21 MED ORDER — PROPOFOL 10 MG/ML IV BOLUS
INTRAVENOUS | Status: DC | PRN
  Administered 2024-04-21: 100 mg via INTRAVENOUS
  Administered 2024-04-21: 160 mg via INTRAVENOUS

## 2024-04-21 MED ORDER — SUCCINYLCHOLINE CHLORIDE 200 MG/10ML IV SOSY
PREFILLED_SYRINGE | INTRAVENOUS | Status: DC | PRN
  Administered 2024-04-21: 120 mg via INTRAVENOUS

## 2024-04-21 MED ORDER — PHENYLEPHRINE 80 MCG/ML (10ML) SYRINGE FOR IV PUSH (FOR BLOOD PRESSURE SUPPORT)
PREFILLED_SYRINGE | INTRAVENOUS | Status: AC
  Filled 2024-04-21: qty 10

## 2024-04-21 MED ORDER — ACETAMINOPHEN 10 MG/ML IV SOLN
INTRAVENOUS | Status: AC
  Filled 2024-04-21: qty 100

## 2024-04-21 MED ORDER — OXYCODONE HCL 5 MG PO TABS: 5.0000 mg | ORAL_TABLET | Freq: Once | ORAL | Status: DC | PRN

## 2024-04-21 MED ORDER — OXYCODONE HCL 5 MG/5ML PO SOLN: 5.0000 mg | Freq: Once | ORAL | Status: DC | PRN

## 2024-04-21 MED ORDER — LIDOCAINE-EPINEPHRINE 1 %-1:100000 IJ SOLN
INTRAMUSCULAR | Status: AC
  Filled 2024-04-21: qty 2

## 2024-04-21 MED ORDER — DROPERIDOL 2.5 MG/ML IJ SOLN
0.6250 mg | Freq: Once | INTRAMUSCULAR | Status: AC | PRN
  Administered 2024-04-21: 0.625 mg via INTRAVENOUS

## 2024-04-21 MED ORDER — LIDOCAINE-EPINEPHRINE 1 %-1:100000 IJ SOLN
INTRAMUSCULAR | Status: DC | PRN
  Administered 2024-04-21: 5 mL

## 2024-04-21 MED ORDER — CEFAZOLIN SODIUM-DEXTROSE 2-4 GM/100ML-% IV SOLN
INTRAVENOUS | Status: AC
Start: 2024-04-21 — End: 2024-04-21
  Filled 2024-04-21: qty 100

## 2024-04-21 MED ORDER — HYDROMORPHONE HCL 1 MG/ML IJ SOLN
INTRAMUSCULAR | Status: AC
  Filled 2024-04-21: qty 0.5

## 2024-04-21 MED ORDER — SUCCINYLCHOLINE CHLORIDE 200 MG/10ML IV SOSY
PREFILLED_SYRINGE | INTRAVENOUS | Status: AC
Start: 2024-04-21 — End: 2024-04-21
  Filled 2024-04-21: qty 10

## 2024-04-21 MED ORDER — TRAMADOL HCL 50 MG PO TABS: 50.0000 mg | ORAL_TABLET | Freq: Four times a day (QID) | ORAL | 0 refills | Status: DC | PRN

## 2024-04-21 MED ORDER — PROPOFOL 500 MG/50ML IV EMUL
INTRAVENOUS | Status: DC | PRN
  Administered 2024-04-21: 150 ug/kg/min via INTRAVENOUS

## 2024-04-21 MED ORDER — DEXMEDETOMIDINE HCL IN NACL 80 MCG/20ML IV SOLN
INTRAVENOUS | Status: DC | PRN
  Administered 2024-04-21: 12 ug via INTRAVENOUS

## 2024-04-21 MED ORDER — SCOPOLAMINE 1 MG/3DAYS TD PT72
MEDICATED_PATCH | TRANSDERMAL | Status: AC
  Filled 2024-04-21: qty 1

## 2024-04-21 MED ORDER — ACETAMINOPHEN 10 MG/ML IV SOLN
INTRAVENOUS | Status: DC | PRN
  Administered 2024-04-21: 1000 mg via INTRAVENOUS

## 2024-04-21 MED ORDER — LABETALOL HCL 5 MG/ML IV SOLN
INTRAVENOUS | Status: AC
Start: 2024-04-21 — End: 2024-04-21
  Filled 2024-04-21: qty 4

## 2024-04-21 MED ORDER — LACTATED RINGERS IV SOLN: INTRAVENOUS | Status: DC

## 2024-04-21 MED ORDER — BUPIVACAINE-EPINEPHRINE (PF) 0.5% -1:200000 IJ SOLN
INTRAMUSCULAR | Status: AC
  Filled 2024-04-21: qty 30

## 2024-04-21 MED ORDER — LIDOCAINE 2% (20 MG/ML) 5 ML SYRINGE
INTRAMUSCULAR | Status: AC
  Filled 2024-04-21: qty 5

## 2024-04-21 MED ORDER — FENTANYL CITRATE (PF) 100 MCG/2ML IJ SOLN
INTRAMUSCULAR | Status: AC
  Filled 2024-04-21: qty 2

## 2024-04-21 SURGICAL SUPPLY — 60 items
BLADE CLIPPER SURG (BLADE) IMPLANT
BLADE SURG 15 STRL LF DISP TIS (BLADE) ×1 IMPLANT
CANISTER SUCT 1200ML W/VALVE (MISCELLANEOUS) ×1 IMPLANT
CORD BIPOLAR FORCEPS 12FT (ELECTRODE) ×1 IMPLANT
COVER PROBE CYLINDRICAL 5X96 (MISCELLANEOUS) ×1 IMPLANT
DERMABOND ADVANCED .7 DNX12 (GAUZE/BANDAGES/DRESSINGS) ×2 IMPLANT
DRAPE C-ARM 35X43 STRL (DRAPES) ×1 IMPLANT
DRAPE HEAD BAR (DRAPES) IMPLANT
DRAPE INCISE IOBAN 66X45 STRL (DRAPES) ×1 IMPLANT
DRAPE MICROSCOPE WILD 40.5X102 (DRAPES) ×1 IMPLANT
DRAPE UTILITY XL STRL (DRAPES) ×1 IMPLANT
DRSG TEGADERM 2-3/8X2-3/4 SM (GAUZE/BANDAGES/DRESSINGS) ×2 IMPLANT
DRSG TEGADERM 4X4.75 (GAUZE/BANDAGES/DRESSINGS) IMPLANT
ELECT COATED BLADE 2.86 ST (ELECTRODE) ×1 IMPLANT
ELECTRODE EMG 18 NIMS (NEUROSURGERY SUPPLIES) ×1 IMPLANT
ELECTRODE REM PT RTRN 9FT ADLT (ELECTROSURGICAL) ×1 IMPLANT
FORCEPS BIPOLAR SPETZLER 8 1.0 (NEUROSURGERY SUPPLIES) ×1 IMPLANT
GAUZE 4X4 16PLY ~~LOC~~+RFID DBL (SPONGE) ×1 IMPLANT
GAUZE SPONGE 4X4 12PLY STRL (GAUZE/BANDAGES/DRESSINGS) ×1 IMPLANT
GENERATOR PULSE INSPIRE (Generator) ×1 IMPLANT
GENERATOR PULSE INSPIRE IV (Generator) ×1 IMPLANT
GLOVE BIO SURGEON STRL SZ 6.5 (GLOVE) IMPLANT
GLOVE BIO SURGEON STRL SZ7.5 (GLOVE) ×1 IMPLANT
GLOVE BIOGEL PI IND STRL 7.0 (GLOVE) IMPLANT
GLOVE BIOGEL PI IND STRL 7.5 (GLOVE) IMPLANT
GLOVE SURG SS PI 7.0 STRL IVOR (GLOVE) IMPLANT
GOWN STRL REUS W/ TWL LRG LVL3 (GOWN DISPOSABLE) ×3 IMPLANT
GOWN STRL REUS W/ TWL XL LVL3 (GOWN DISPOSABLE) IMPLANT
IV CATH 18G SAFETY (IV SOLUTION) ×1 IMPLANT
KIT NEURO ACCESSORY W/WRENCH (MISCELLANEOUS) IMPLANT
LEAD SENSING RESP INSPIRE (Lead) ×1 IMPLANT
LEAD SENSING RESP INSPIRE IV (Lead) ×1 IMPLANT
LEAD SLEEP STIM INSPIRE IV/V (Lead) ×1 IMPLANT
LEAD SLEEP STIMULATION INSPIRE (Lead) ×1 IMPLANT
LOOP VASCLR MAXI BLUE 18IN ST (MISCELLANEOUS) ×1 IMPLANT
LOOP VESSEL MINI RED (MISCELLANEOUS) ×1 IMPLANT
LOOPS VASCLR MAXI BLUE 18IN ST (MISCELLANEOUS) ×1 IMPLANT
MARKER SKIN DUAL TIP RULER LAB (MISCELLANEOUS) ×1 IMPLANT
NDL HYPO 25X1 1.5 SAFETY (NEEDLE) ×1 IMPLANT
NEEDLE HYPO 25X1 1.5 SAFETY (NEEDLE) ×1 IMPLANT
NS IRRIG 1000ML POUR BTL (IV SOLUTION) ×1 IMPLANT
PACK BASIN DAY SURGERY FS (CUSTOM PROCEDURE TRAY) ×1 IMPLANT
PACK ENT DAY SURGERY (CUSTOM PROCEDURE TRAY) ×1 IMPLANT
PASSER CATH 36 CODMAN DISP (NEUROSURGERY SUPPLIES) IMPLANT
PASSER CATH 38CM DISP (INSTRUMENTS) IMPLANT
PENCIL SMOKE EVACUATOR (MISCELLANEOUS) ×1 IMPLANT
PROBE NERVE STIMULATOR (NEUROSURGERY SUPPLIES) ×1 IMPLANT
REMOTE CONTROL SLEEP INSPIRE (MISCELLANEOUS) ×1 IMPLANT
SET WALTER ACTIVATION W/DRAPE (SET/KITS/TRAYS/PACK) ×1 IMPLANT
SLEEVE SCD COMPRESS KNEE MED (STOCKING) ×1 IMPLANT
SPONGE INTESTINAL PEANUT (DISPOSABLE) ×1 IMPLANT
SUT SILK 2 0 SH (SUTURE) ×1 IMPLANT
SUT SILK 3 0 REEL (SUTURE) ×1 IMPLANT
SUT SILK 3 0 SH 30 (SUTURE) ×1 IMPLANT
SUT VIC AB 3-0 SH 27X BRD (SUTURE) ×2 IMPLANT
SUT VIC AB 4-0 PS2 27 (SUTURE) ×2 IMPLANT
SUTURE SILK 3-0 RB1 30XBRD (SUTURE) ×1 IMPLANT
SYR 10ML LL (SYRINGE) ×1 IMPLANT
SYR BULB EAR ULCER 3OZ GRN STR (SYRINGE) ×1 IMPLANT
TOWEL GREEN STERILE FF (TOWEL DISPOSABLE) ×2 IMPLANT

## 2024-04-21 NOTE — OR Nursing (Signed)
 Spoke with [employer] support and assisted patient to turn off bladder stimulator.  Spoke with surgeon that [employer] support recommends only bipolar cautery be utilized for procedure even with bladder stimulator off.

## 2024-04-21 NOTE — Discharge Instructions (Signed)
  Post Anesthesia Home Care Instructions  Activity: Get plenty of rest for the remainder of the day. A responsible individual must stay with you for 24 hours following the procedure.  For the next 24 hours, DO NOT: -Drive a car -Advertising copywriter -Drink alcoholic beverages -Take any medication unless instructed by your physician -Make any legal decisions or sign important papers.  Meals: Start with liquid foods such as gelatin or soup. Progress to regular foods as tolerated. Avoid greasy, spicy, heavy foods. If nausea and/or vomiting occur, drink only clear liquids until the nausea and/or vomiting subsides. Call your physician if vomiting continues.  Special Instructions/Symptoms: Your throat may feel dry or sore from the anesthesia or the breathing tube placed in your throat during surgery. If this causes discomfort, gargle with warm salt water. The discomfort should disappear within 24 hours.  If you had a scopolamine  patch placed behind your ear for the management of post- operative nausea and/or vomiting:  1. The medication in the patch is effective for 72 hours, after which it should be removed.  Wrap patch in a tissue and discard in the trash. Wash hands thoroughly with soap and water. 2. You may remove the patch earlier than 72 hours if you experience unpleasant side effects which may include dry mouth, dizziness or visual disturbances. 3. Avoid touching the patch. Wash your hands with soap and water after contact with the patch.    No tylenol  until after 635pm

## 2024-04-21 NOTE — Anesthesia Preprocedure Evaluation (Addendum)
 Anesthesia Evaluation  Patient identified by MRN, date of birth, ID band Patient awake    Reviewed: Allergy & Precautions, NPO status , Patient's Chart, lab work & pertinent test results  History of Anesthesia Complications (+) PONV and history of anesthetic complications  Airway Mallampati: III  TM Distance: >3 FB     Dental no notable dental hx. (+) Dental Advisory Given, Chipped,    Pulmonary asthma , sleep apnea , former smoker   Pulmonary exam normal breath sounds clear to auscultation       Cardiovascular hypertension, Pt. on medications Normal cardiovascular exam Rhythm:Regular Rate:Normal     Neuro/Psych  PSYCHIATRIC DISORDERS Anxiety  Bipolar Disorder   negative neurological ROS     GI/Hepatic Neg liver ROS,GERD  ,,  Endo/Other  Hypothyroidism  Obesity GLP-1 RA therapy- last dose  Renal/GU negative Renal ROS  negative genitourinary   Musculoskeletal negative musculoskeletal ROS (+)    Abdominal  (+) + obese  Peds  Hematology negative hematology ROS (+)   Anesthesia Other Findings   Reproductive/Obstetrics                              Anesthesia Physical Anesthesia Plan  ASA: 3  Anesthesia Plan: General   Post-op Pain Management: Minimal or no pain anticipated, Precedex , Dilaudid  IV, Ofirmev  IV (intra-op)* and Ketamine IV*   Induction: Intravenous  PONV Risk Score and Plan: 4 or greater and Treatment may vary due to age or medical condition, Midazolam , Ondansetron , Dexamethasone , Scopolamine  patch - Pre-op, Propofol  infusion and TIVA  Airway Management Planned: Oral ETT  Additional Equipment: None  Intra-op Plan:   Post-operative Plan: Extubation in OR  Informed Consent: I have reviewed the patients History and Physical, chart, labs and discussed the procedure including the risks, benefits and alternatives for the proposed anesthesia with the patient or authorized  representative who has indicated his/her understanding and acceptance.     Dental advisory given  Plan Discussed with: CRNA and Anesthesiologist  Anesthesia Plan Comments:          Anesthesia Quick Evaluation

## 2024-04-21 NOTE — H&P (Signed)
 Emma [name] is an 63 y.o. female.   Chief Complaint: Sleep apnea HPI: 63 year old female with sleep apnea who has been unable to tolerate CPAP.  Past Medical History:  Diagnosis Date   Anxiety    Asthma    GERD (gastroesophageal reflux disease)    Hypertension    PONV (postoperative nausea and vomiting)    Sleep apnea    non compliant with CPAP   Thyroid  disease     Past Surgical History:  Procedure Laterality Date   ABDOMINAL HYSTERECTOMY     CHOLECYSTECTOMY     DRUG INDUCED ENDOSCOPY Bilateral 03/03/2024   Procedure: DRUG INDUCED SLEEP ENDOSCOPY;  Surgeon: Carlie Satcher, MD;  Location: [facility] ENDOSCOPY;  Service: ENT;  Laterality: Bilateral;   JOINT REPLACEMENT     SHOULDER ARTHROSCOPY WITH SUBACROMIAL DECOMPRESSION, ROTATOR CUFF REPAIR AND BICEP TENDON REPAIR Left 04/01/2019   Procedure: LEFT SHOULDER ARTHROSCOPY WITH SUBACROMIAL DECOMPRESSION, DISTAL CLAVICLE EXCISION, BICEP TENODESIS, ROTATOR CUFF REPAIR, spinal needle decrompression of glenoid cyst;  Surgeon: Beverley Evalene BIRCH, MD;  Location: [facility];  Service: Orthopedics;  Laterality: Left;    Family History  Problem Relation Age of Onset   Breast cancer Mother 20   Dementia Mother    Drug abuse Mother    Breast cancer Sister 40   Stomach cancer Father    Heart disease Father    Obesity Brother    Breast cancer Paternal Grandmother    Social History:  reports that she quit smoking about 14 years ago. Her smoking use included cigarettes. She has never used smokeless tobacco. She reports current alcohol use. She reports that she does not use drugs.  Allergies:  Allergies  Allergen Reactions   Latuda  [Lurasidone ] Other (See Comments)    Closed her throat   Oxcarbazepine  Nausea Only   Codeine Nausea Only    Medications Prior to Admission  Medication Sig Dispense Refill   amphetamine -dextroamphetamine  (ADDERALL) 10 MG tablet Take 0.5-1 tablets (5-10 mg total) by mouth daily with breakfast. 30 tablet 0    amphetamine -dextroamphetamine  (ADDERALL) 10 MG tablet Take 0.5-1 tablets (5-10 mg total) by mouth daily with breakfast. 30 tablet 0   b complex vitamins capsule Take 1 capsule by mouth daily.     cetirizine (ZYRTEC) 10 MG tablet Take 10 mg by mouth daily. prn     clonazePAM  (KLONOPIN ) 0.5 MG tablet Take 1 tablet (0.5 mg total) by mouth 2 (two) times daily as needed for anxiety. 30 tablet 2   DULoxetine  (CYMBALTA ) 20 MG capsule Take 2 capsules (40 mg total) by mouth daily. 180 capsule 0   estradiol (ESTRACE) 2 MG tablet Take 2 mg by mouth daily.      eszopiclone  (LUNESTA ) 2 MG TABS tablet Take 1 tablet (2 mg total) by mouth at bedtime as needed for sleep. Take immediately before bedtime 30 tablet 2   fluticasone (FLONASE) 50 MCG/ACT nasal spray      levothyroxine  (SYNTHROID ) 50 MCG tablet Take 1 tablet (50 mcg total) by mouth daily before breakfast. 90 tablet 3   losartan  (COZAAR ) 50 MG tablet Take 50 mg by mouth daily.     meloxicam (MOBIC) 15 MG tablet Take 15 mg by mouth daily.     Omega-3 Fatty Acids (FISH OIL) 1000 MG CAPS Take by mouth.     omeprazole (PRILOSEC) 40 MG capsule Take 40 mg by mouth daily.     Probiotic Product (PROBIOTIC-10 PO) Take by mouth.     promethazine  (PHENERGAN ) 25  MG tablet Take 1 tablet (25 mg total) by mouth every 6 (six) hours as needed for nausea or vomiting. 30 tablet 0   vitamin E 1000 UNIT capsule Take 1,000 Units by mouth daily.     tirzepatide (MOUNJARO) 2.5 MG/0.5ML Pen Inject 2.5 mg into the skin once a week.     UNABLE TO FIND CPAP      No results found for this or any previous visit (from the past 48 hours). No results found.  Review of Systems  All other systems reviewed and are negative.   Blood pressure 135/75, pulse 97, temperature 97.9 F (36.6 C), temperature source Temporal, resp. rate 17, height 5' 2 (1.575 m), weight 75.4 kg, SpO2 99%. Physical Exam Constitutional:      Appearance: Normal appearance. She is normal weight.  HENT:      Head: Normocephalic and atraumatic.     Right Ear: External ear normal.     Left Ear: External ear normal.     Nose: Nose normal.     Mouth/Throat:     Mouth: Mucous membranes are moist.     Pharynx: Oropharynx is clear.  Eyes:     Extraocular Movements: Extraocular movements intact.     Pupils: Pupils are equal, round, and reactive to light.  Cardiovascular:     Rate and Rhythm: Normal rate.  Pulmonary:     Effort: Pulmonary effort is normal.  Skin:    General: Skin is warm and dry.  Neurological:     General: No focal deficit present.     Mental Status: She is alert and oriented to person, place, and time.  Psychiatric:        Mood and Affect: Mood normal.        Behavior: Behavior normal.        Thought Content: Thought content normal.        Judgment: Judgment normal.      Assessment/Plan Obstructive sleep apnea and BMI 30.40.  To OR for hypoglossal nerve stimulator placement.  Vaughan Ricker, MD 04/21/2024, 11:31 AM

## 2024-04-21 NOTE — Transfer of Care (Signed)
 Immediate Anesthesia Transfer of Care Note  Patient: Emma [name]  Procedure(s) Performed: INSERTION, HYPOGLOSSAL NERVE STIMULATOR (Right: Chest)  Patient Location: PACU  Anesthesia Type:General  Level of Consciousness: awake, alert , oriented, and patient cooperative  Airway & Oxygen Therapy: Patient Spontanous Breathing and Patient connected to face mask oxygen  Post-op Assessment: Report given to RN and Post -op Vital signs reviewed and stable  Post vital signs: Reviewed and stable  Last Vitals:  Vitals Value Taken Time  BP 104/63 04/21/24 14:00  Temp 36.3 C 04/21/24 14:00  Pulse 80 04/21/24 14:07  Resp 15 04/21/24 14:07  SpO2 99 % 04/21/24 14:07  Vitals shown include unfiled device data.  Last Pain:  Vitals:   04/21/24 1407  TempSrc:   PainSc: 8       Patients Stated Pain Goal: 1 (04/21/24 0942)  Complications: No notable events documented.

## 2024-04-21 NOTE — Op Note (Signed)

## 2024-04-21 NOTE — Anesthesia Postprocedure Evaluation (Signed)
 Anesthesia Post Note  Patient: Emma [name]  Procedure(s) Performed: INSERTION, HYPOGLOSSAL NERVE STIMULATOR (Right: Chest)     Patient location during evaluation: PACU Anesthesia Type: General Level of consciousness: awake and alert and oriented Pain management: pain level controlled Vital Signs Assessment: post-procedure vital signs reviewed and stable Respiratory status: spontaneous breathing, nonlabored ventilation and respiratory function stable Cardiovascular status: blood pressure returned to baseline and stable Postop Assessment: no apparent nausea or vomiting Anesthetic complications: no   No notable events documented.  Last Vitals:  Vitals:   04/21/24 1445 04/21/24 1500  BP: 129/70 130/74  Pulse: 76 81  Resp: 13 15  Temp:    SpO2: 92% 94%    Last Pain:  Vitals:   04/21/24 1500  TempSrc:   PainSc: Asleep                 Traniyah Hallett A.

## 2024-04-21 NOTE — Anesthesia Procedure Notes (Signed)
 Procedure Name: Intubation Date/Time: 04/21/2024 12:10 PM  Performed by: Denton Niels CROME, CRNAPre-anesthesia Checklist: Patient identified, Emergency Drugs available, Suction available and Patient being monitored Patient Re-evaluated:Patient Re-evaluated prior to induction Oxygen Delivery Method: Circle system utilized Preoxygenation: Pre-oxygenation with 100% oxygen Induction Type: IV induction Ventilation: Mask ventilation without difficulty Grade View: Grade II Tube type: Oral Tube size: 7.0 mm Number of attempts: 1 Airway Equipment and Method: Stylet Placement Confirmation: ETT inserted through vocal cords under direct vision, positive ETCO2 and breath sounds checked- equal and bilateral Secured at: 22 cm Tube secured with: Tape Dental Injury: Teeth and Oropharynx as per pre-operative assessment

## 2024-04-22 ENCOUNTER — Encounter (HOSPITAL_BASED_OUTPATIENT_CLINIC_OR_DEPARTMENT_OTHER): Payer: Self-pay | Admitting: Otolaryngology

## 2024-05-06 ENCOUNTER — Ambulatory Visit: Admitting: Physician Assistant

## 2024-06-01 ENCOUNTER — Ambulatory Visit (INDEPENDENT_AMBULATORY_CARE_PROVIDER_SITE_OTHER): Admitting: Physician Assistant

## 2024-06-01 ENCOUNTER — Encounter: Payer: Self-pay | Admitting: Physician Assistant

## 2024-06-01 DIAGNOSIS — F411 Generalized anxiety disorder: Secondary | ICD-10-CM

## 2024-06-01 DIAGNOSIS — G4733 Obstructive sleep apnea (adult) (pediatric): Secondary | ICD-10-CM

## 2024-06-01 DIAGNOSIS — R5383 Other fatigue: Secondary | ICD-10-CM

## 2024-06-01 DIAGNOSIS — F5105 Insomnia due to other mental disorder: Secondary | ICD-10-CM

## 2024-06-01 DIAGNOSIS — F99 Mental disorder, not otherwise specified: Secondary | ICD-10-CM

## 2024-06-01 DIAGNOSIS — F319 Bipolar disorder, unspecified: Secondary | ICD-10-CM

## 2024-06-01 DIAGNOSIS — R5381 Other malaise: Secondary | ICD-10-CM

## 2024-06-01 MED ORDER — SUNOSI 150 MG PO TABS: ORAL_TABLET | ORAL | 2 refills | Status: DC

## 2024-06-01 NOTE — Progress Notes (Unsigned)
 [facility] Check  Patient ID: Emma [name],  MRN: [id number]  PCP: [place], Julie M, NP  Date of Evaluation: 06/01/2024  Time spent:20 minutes  Chief Complaint:  Chief Complaint   Follow-up    HISTORY/CURRENT STATUS: HPI  For routine med check.  Has had a lot of things going on. Moving. Had Inspire inserted over the summer.  It's only been turned on for a week.  She is able to enjoy things.  Motivation is good.  Still has a lot of trouble waking up and not being drowsy.  Adderall did not really help and caused severe dry mouth so she stopped it.  She has taken Sunosi  in the past which worked great.  It was stopped because her insurance would not pay for it.  No extreme sadness, tearfulness, or feelings of hopelessness.  ADLs and personal hygiene are normal.   Appetite has not changed.  Has anxiety sometimes, not really panic attacks but more of a sense of being overwhelmed and like something bad may happen.  The Klonopin  is helpful when needed.  No SI/HI.  Patient denies increased energy with decreased need for sleep, increased talkativeness, racing thoughts, impulsivity or risky behaviors, increased spending, increased libido, grandiosity, increased irritability or anger, paranoia, or hallucinations.  Individual Medical History/ Review of Systems: Changes? :Yes    see HPI  Past medications for mental health diagnoses include: Cymbalta  causes sweating/hot flashes above 40 mg, Prozac worked for [employer], Effexor caused her to be a zombie, Wellbutrin caused anger, Klonopin , Lunesta , Zoloft, Ambien, Trazodone, Trileptal  caused severe nausea, Lamictal  caused insomnia, Latuda  caused h/a, had 'body heat not like hot flashes from menopause.' Sunosi  too expensive, Caplyta  made her 'feel like she's smoked pot all day' after 1 pill. Ritalin  caused fatigue, Modafinil , she tried Adderall many years ago w/o Rx.   Allergies: Latuda  [lurasidone ], Oxcarbazepine , and Codeine  Current Medications:   Current Outpatient Medications:    b complex vitamins capsule, Take 1 capsule by mouth daily., Disp: , Rfl:    cetirizine (ZYRTEC) 10 MG tablet, Take 10 mg by mouth daily. prn, Disp: , Rfl:    clonazePAM  (KLONOPIN ) 0.5 MG tablet, Take 1 tablet (0.5 mg total) by mouth 2 (two) times daily as needed for anxiety., Disp: 30 tablet, Rfl: 2   DULoxetine  (CYMBALTA ) 20 MG capsule, Take 2 capsules (40 mg total) by mouth daily., Disp: 180 capsule, Rfl: 0   estradiol (ESTRACE) 2 MG tablet, Take 2 mg by mouth daily. , Disp: , Rfl:    eszopiclone  (LUNESTA ) 2 MG TABS tablet, Take 1 tablet (2 mg total) by mouth at bedtime as needed for sleep. Take immediately before bedtime, Disp: 30 tablet, Rfl: 2   fluticasone (FLONASE) 50 MCG/ACT nasal spray, , Disp: , Rfl:    levothyroxine  (SYNTHROID ) 50 MCG tablet, Take 1 tablet (50 mcg total) by mouth daily before breakfast., Disp: 90 tablet, Rfl: 3   losartan  (COZAAR ) 50 MG tablet, Take 50 mg by mouth daily., Disp: , Rfl:    meloxicam (MOBIC) 15 MG tablet, Take 15 mg by mouth daily., Disp: , Rfl:    Omega-3 Fatty Acids (FISH OIL) 1000 MG CAPS, Take by mouth., Disp: , Rfl:    omeprazole (PRILOSEC) 40 MG capsule, Take 40 mg by mouth daily., Disp: , Rfl:    Probiotic Product (PROBIOTIC-10 PO), Take by mouth., Disp: , Rfl:    promethazine  (PHENERGAN ) 25 MG tablet, Take 1 tablet (25 mg total) by mouth every 6 (six) hours as needed for  nausea or vomiting., Disp: 30 tablet, Rfl: 0   tirzepatide (MOUNJARO) 2.5 MG/0.5ML Pen, Inject 2.5 mg into the skin once a week., Disp: , Rfl:    traMADol  (ULTRAM ) 50 MG tablet, Take 1-2 tablets (50-100 mg total) by mouth every 6 (six) hours as needed., Disp: 20 tablet, Rfl: 0   UNABLE TO FIND, Inspire implanted 04/21/2024., Disp: , Rfl:    vitamin E 1000 UNIT capsule, Take 1,000 Units by mouth daily., Disp: , Rfl:    Solriamfetol  HCl (SUNOSI ) 150 MG TABS, 1/2 po q am for 4 days, then may increase to 1 po q am., Disp: 30 tablet, Rfl: 2   UNABLE  TO FIND, CPAP (Patient not taking: Reported on 06/01/2024), Disp: , Rfl:  Medication Side Effects: none  Family Medical/ Social History: Changes? none  MENTAL HEALTH EXAM:  There were no vitals taken for this visit.There is no height or weight on file to calculate BMI.  General Appearance: Casual, Well Groomed, and Obese  Eye Contact:  Good  Speech:  Clear and Coherent and Normal Rate  Volume:  Normal  Mood:  Euthymic  Affect:  Congruent  Thought Process:  Goal Directed and Descriptions of Associations: Circumstantial  Orientation:  Full (Time, Place, and Person)  Thought Content: Logical   Suicidal Thoughts:  No  Homicidal Thoughts:  No  Memory:  WNL  Judgement:  Good  Insight:  Good  Psychomotor Activity:  Normal  Concentration:  Concentration: Good and Attention Span: Good  Recall:  Good  Fund of Knowledge: Good  Language: Good  Assets:  Communication Skills Desire for Improvement Financial Resources/Insurance Housing Resilience Social Support Transportation  ADL's:  Intact  Cognition: WNL  Prognosis:  Good   GeneSight test results are on chart under media/scan.  DIAGNOSES:    ICD-10-CM   1. Bipolar I disorder (HCC)  F31.9     2. Malaise and fatigue  R53.81    R53.83     3. Obstructive sleep apnea  G47.33     4. Generalized anxiety disorder  F41.1     5. Insomnia due to other mental disorder  F51.05    F99      Receiving Psychotherapy: No   RECOMMENDATIONS:  PDMP reviewed.  Adderall filled 02/05/2024.   Last Klonopin  04/21/2024.  Lunesta  filled 02/04/3034. I provided approximately 20 minutes of face to face time during this encounter, including time spent before and after the visit in records review, medical decision making, counseling pertinent to today's visit, and charting.   She already discontinued Adderall.  I recommend restarting Sunosi .  I believe her insurance will cover it now.  She would like to try it.  Continue Klonopin  0.5 mg 1 p.o. twice  daily as needed.  Take as sparingly as possible. Continue Cymbalta  20 mg, 2 p.o. qd. (Sweats too much at higher doses) Continue Lunesta   2 mg hs prn. (Rarely takes.) Restart Sunosi  150 mg, 1/2 pill every morning for 4 days then increase to 1 p.o. every morning if well-tolerated and 1/2 pill is not effective. Continue vitamins as per med sheet. Return in 6 weeks.  Verneita Cooks, PA-C

## 2024-06-07 ENCOUNTER — Telehealth: Payer: Self-pay

## 2024-06-07 NOTE — Telephone Encounter (Signed)
 PA Sunosi  150 mg approved through 06/07/25 with [employer] PA# [id number]

## 2024-06-24 ENCOUNTER — Other Ambulatory Visit: Payer: Self-pay | Admitting: Urology

## 2024-06-28 NOTE — Progress Notes (Signed)
 Patient received 1 allergy injection(s).  Did the patient agree to remain in the clinic 30 minutes after the injection is received to observe for systemic/site reactions? Yes.  During previous injections, did the patient experience:  Wheezing? No Shortness of breath? No Cough? No Swelling / redness at injection site? No  Has the patient:  Become pregnant? No Been prescribed a beta blocker? No Had an increase in asthma symptoms? No Increased use of asthma medications? No Had wheezing today? No Had shortness of breath today? No Had coughing today? No Had chest tightness today? No Had a fever today? No  After receiving the allergy shot, the patient  tolerated without any difficulty

## 2024-07-06 NOTE — Patient Instructions (Addendum)
 SURGICAL WAITING ROOM VISITATION Patients having surgery or a procedure may have no more than 2 support people in the waiting area - these visitors may rotate in the visitor waiting room.   Due to an increase in RSV and influenza rates and associated hospitalizations, children ages 41 and under may not visit patients in [facility] hospitals. If the patient needs to stay at the hospital during part of their recovery, the visitor guidelines for inpatient rooms apply.  PRE-OP VISITATION  Pre-op nurse will coordinate an appropriate time for 1 support person to accompany the patient in pre-op.  This support person may not rotate.  This visitor will be contacted when the time is appropriate for the visitor to come back in the pre-op area.  Please refer to the [facility] website for the visitor guidelines for Inpatients (after your surgery is over and you are in a regular room).  You are not required to quarantine at this time prior to your surgery. However, you must do this: Hand Hygiene often Do NOT share personal items Notify your provider if you are in close contact with someone who has COVID or you develop fever 100.4 or greater, new onset of sneezing, cough, sore throat, shortness of breath or body aches.  If you test positive for Covid or have been in contact with anyone that has tested positive in the last 10 days please notify you surgeon.    Your procedure is scheduled on:  07/12/24  Report to [facility] Main Entrance: [place] entrance where the [employer] is available.   Report to admitting at: 10:40 AM  Call this number if you have any questions or problems the morning of surgery [phone]  FOLLOW ANY ADDITIONAL PRE OP INSTRUCTIONS YOU RECEIVED FROM YOUR SURGEON'S OFFICE!!! BRING THE CONTROL FOR THE STIMULATOR THE DAY OF SURGERY.  Do not eat food or drink fluids after Midnight the night prior to your surgery/procedure.   Oral Hygiene is also important to reduce  your risk of infection.        Remember - BRUSH YOUR TEETH THE MORNING OF SURGERY WITH YOUR REGULAR TOOTHPASTE  Do NOT smoke after Midnight the night before surgery.  STOP TAKING all Vitamins, Herbs and supplements 1 week before your surgery.   Take ONLY these medicines the morning of surgery with A SIP OF WATER: duloxetine ,estradiol,levothyroxine ,omeprazole.Clonazepam ,cetirizine,sumatriptan as needed.  DO NOT TAKE THE FOLLOWING 7 DAYS PRIOR TO SURGERY: Ozempic, Wegovy, Rybelsus (Semaglutide), Byetta (exenatide), Bydureon (exenatide ER), Victoza, Saxenda (liraglutide), or Trulicity (dulaglutide) Mounjaro (Tirzepatide) Adlyxin (Lixisenatide), Polyethylene Glycol Loxenatide. HOLD Mounjaro after: 07/04/24  If You have been diagnosed with Sleep Apnea - Bring CPAP mask and tubing day of surgery. We will provide you with a CPAP machine on the day of your surgery.                   You may not have any metal on your body including hair pins, jewelry, and body piercing  Do not wear make-up, lotions, powders, perfumes / cologne, or deodorant  Do not wear nail polish including gel and S&S, artificial / acrylic nails, or any other type of covering on natural nails including finger and toenails. If you have artificial nails, gel coating, etc., that needs to be removed by a nail salon, Please have this removed prior to surgery. Not doing so may mean that your surgery could be cancelled or delayed if the Surgeon or anesthesia staff feels like they are unable to monitor you  safely.   Do not shave 48 hours prior to surgery to avoid nicks in your skin which may contribute to postoperative infections.   Contacts, Hearing Aids, dentures or bridgework may not be worn into surgery. DENTURES WILL BE REMOVED PRIOR TO SURGERY PLEASE DO NOT APPLY Poly grip OR ADHESIVES!!!  You may bring a small overnight bag with you on the day of surgery, only pack items that are not valuable. [facility] IS NOT RESPONSIBLE   FOR  VALUABLES THAT ARE LOST OR STOLEN.   Patients discharged on the day of surgery will not be allowed to drive home.  Someone NEEDS to stay with you for the first 24 hours after anesthesia.  Do not bring your home medications to the hospital. The Pharmacy will dispense medications listed on your medication list to you during your admission in the Hospital.  Special Instructions: Bring a copy of your healthcare power of attorney and living will documents the day of surgery, if you wish to have them scanned into your [facility] Records- EPIC  Please read over the following fact sheets you were given: IF YOU HAVE QUESTIONS ABOUT YOUR PRE-OP INSTRUCTIONS, PLEASE CALL [phone]   [facility] : [facility] - Preparing for Surgery      Before surgery, you can play an important role.  Because skin is not sterile, your skin needs to be as free of germs as possible.  You can reduce the number of germs on your skin by washing with CHG (chlorahexidine gluconate) soap before surgery.  CHG is an antiseptic cleaner which kills germs and bonds with the skin to continue killing germs even after washing. Please DO NOT use if you have an allergy to CHG or antibacterial soaps.  If your skin becomes reddened/irritated stop using the CHG and inform your nurse when you arrive at Short Stay. Do not shave (including legs and underarms) for at least 48 hours prior to the first CHG shower.  You may shave your face/neck.  Please follow these instructions carefully:  1.  Shower with CHG Soap the night before surgery ONLY (DO NOT USE THE SOAP THE MORNING OF SURGERY).  2.  If you choose to wash your hair, wash your hair first as usual with your normal  shampoo.  3.  After you shampoo, rinse your hair and body thoroughly to remove the shampoo.                             4.  Use CHG as you would any other liquid soap.  You can apply chg directly to the skin and wash.  Gently with a scrungie or clean washcloth.  5.  Apply the CHG  Soap to your body ONLY FROM THE NECK DOWN.   Do not use on face/ open                           Wound or open sores. Avoid contact with eyes, ears mouth and genitals (private parts).                       Wash face,  Genitals (private parts) with your normal soap.             6.  Wash thoroughly, paying special attention to the area where your  surgery  will be performed.  7.  Thoroughly rinse your body with warm water from the neck  down.  8.  DO NOT shower/wash with your normal soap after using and rinsing off the CHG Soap.                9.  Pat yourself dry with a clean towel.            10.  Wear clean pajamas.            11.  Place clean sheets on your bed the night of your first shower and do not  sleep with pets.  Day of Surgery : Do not apply any CHG, lotions/deodorants the morning of surgery.  Please wear clean clothes to the hospital/surgery center.   FAILURE TO FOLLOW THESE INSTRUCTIONS MAY RESULT IN THE CANCELLATION OF YOUR SURGERY  PATIENT SIGNATURE_________________________________  NURSE SIGNATURE__________________________________  ________________________________________________________________________

## 2024-07-07 ENCOUNTER — Other Ambulatory Visit: Payer: Self-pay

## 2024-07-07 ENCOUNTER — Encounter (HOSPITAL_COMMUNITY)
Admission: RE | Admit: 2024-07-07 | Discharge: 2024-07-07 | Disposition: A | Discharge status: Home / Self Care | Source: Ambulatory Visit | Attending: Urology | Admitting: Urology

## 2024-07-07 ENCOUNTER — Encounter (HOSPITAL_COMMUNITY): Payer: Self-pay

## 2024-07-07 VITALS — BP 132/82 | HR 97 | Temp 97.8°F | Ht 62.0 in | Wt 168.0 lb

## 2024-07-07 DIAGNOSIS — I10 Essential (primary) hypertension: Secondary | ICD-10-CM | POA: Diagnosis not present

## 2024-07-07 DIAGNOSIS — Z01812 Encounter for preprocedural laboratory examination: Secondary | ICD-10-CM | POA: Insufficient documentation

## 2024-07-07 HISTORY — DX: Hypothyroidism, unspecified: E03.9

## 2024-07-07 LAB — BASIC METABOLIC PANEL WITH GFR
Anion gap: 9 (ref 5–15)
BUN: 14 mg/dL (ref 8–23)
CO2: 26 mmol/L (ref 22–32)
Calcium: 9.3 mg/dL (ref 8.9–10.3)
Chloride: 101 mmol/L (ref 98–111)
Creatinine, Ser: 0.67 mg/dL (ref 0.44–1.00)
GFR, Estimated: >60 mL/min (ref >60)
Glucose, Bld: 93 mg/dL (ref 70–99)
Potassium: 4.1 mmol/L (ref 3.5–5.1)
Sodium: 137 mmol/L (ref 135–145)

## 2024-07-07 LAB — CBC
HCT: 45.2 % (ref 36.0–46.0)
Hemoglobin: 15.1 g/dL — ABNORMAL HIGH (ref 12.0–15.0)
MCH: 28.2 pg (ref 26.0–34.0)
MCHC: 33.4 g/dL (ref 30.0–36.0)
MCV: 84.5 fL (ref 80.0–100.0)
Platelets: 369 K/uL (ref 150–400)
RBC: 5.35 MIL/uL — ABNORMAL HIGH (ref 3.87–5.11)
RDW: 12.2 % (ref 11.5–15.5)
WBC: 9 K/uL (ref 4.0–10.5)
nRBC: 0 % (ref 0.0–0.2)

## 2024-07-07 NOTE — Progress Notes (Signed)
 For Anesthesia: PCP - Jordan, Julie M, NP  Cardiologist - N/A  Bowel Prep reminder:N/A  Chest x-ray - 04/21/24 EKG -  04/21/24 Stress Test -  ECHO -  Cardiac Cath -  Pacemaker/ICD device last checked: Pacemaker orders received: Device Rep notified:  Spinal Cord Stimulator: Sacral stimulator/hypoglossal stimulator.  Sleep Study - Yes CPAP - NO. Hypoglossal stimulator  Fasting Blood Sugar -  N/A Checks Blood Sugar _____ times a day Date and result of last Hgb A1c-  Last dose of GLP1 agonist- Mounjaro : Last dose 06/29/24 GLP1 instructions: Hold 7 days prior to schedule (Hold 24 hours-daily)   Last dose of SGLT-2 inhibitors- N/A SGLT-2 instructions: Hold 72 hours prior to surgery  Blood Thinner Instructions:N/A Last Dose: Time last taken:  Aspirin Instructions:N/A Last Dose: Time last taken:  Activity level: Can go up a flight of stairs and activities of daily living without stopping and without chest pain and/or shortness of breath   Able to exercise without chest pain and/or shortness of breath  Anesthesia review:   Patient denies shortness of breath, fever, cough and chest pain at PAT appointment   Patient verbalized understanding of instructions that were reviewed over the telephone.

## 2024-07-12 ENCOUNTER — Ambulatory Visit (HOSPITAL_COMMUNITY)
Admission: RE | Admit: 2024-07-12 | Discharge: 2024-07-12 | Disposition: A | Discharge status: Home / Self Care | Attending: Urology | Admitting: Urology

## 2024-07-12 ENCOUNTER — Ambulatory Visit (HOSPITAL_COMMUNITY): Admitting: Anesthesiology

## 2024-07-12 ENCOUNTER — Encounter (HOSPITAL_COMMUNITY): Payer: Self-pay | Admitting: Urology

## 2024-07-12 ENCOUNTER — Ambulatory Visit (HOSPITAL_COMMUNITY): Payer: Self-pay | Admitting: Medical

## 2024-07-12 ENCOUNTER — Encounter (HOSPITAL_COMMUNITY)
Admission: RE | Disposition: A | Discharge status: Home / Self Care | Payer: Self-pay | Source: Home / Self Care | Attending: Urology

## 2024-07-12 ENCOUNTER — Ambulatory Visit: Admitting: Physician Assistant

## 2024-07-12 ENCOUNTER — Ambulatory Visit (HOSPITAL_COMMUNITY)

## 2024-07-12 DIAGNOSIS — F419 Anxiety disorder, unspecified: Secondary | ICD-10-CM | POA: Diagnosis not present

## 2024-07-12 DIAGNOSIS — K219 Gastro-esophageal reflux disease without esophagitis: Secondary | ICD-10-CM | POA: Insufficient documentation

## 2024-07-12 DIAGNOSIS — G4730 Sleep apnea, unspecified: Secondary | ICD-10-CM

## 2024-07-12 DIAGNOSIS — Z87891 Personal history of nicotine dependence: Secondary | ICD-10-CM | POA: Diagnosis not present

## 2024-07-12 DIAGNOSIS — J45909 Unspecified asthma, uncomplicated: Secondary | ICD-10-CM | POA: Diagnosis not present

## 2024-07-12 DIAGNOSIS — Y758 Miscellaneous neurological devices associated with adverse incidents, not elsewhere classified: Secondary | ICD-10-CM | POA: Insufficient documentation

## 2024-07-12 DIAGNOSIS — I10 Essential (primary) hypertension: Secondary | ICD-10-CM | POA: Insufficient documentation

## 2024-07-12 DIAGNOSIS — T85112A Breakdown (mechanical) of implanted electronic neurostimulator (electrode) of spinal cord, initial encounter: Secondary | ICD-10-CM | POA: Diagnosis not present

## 2024-07-12 DIAGNOSIS — E039 Hypothyroidism, unspecified: Secondary | ICD-10-CM | POA: Diagnosis not present

## 2024-07-12 DIAGNOSIS — N3941 Urge incontinence: Secondary | ICD-10-CM

## 2024-07-12 DIAGNOSIS — F319 Bipolar disorder, unspecified: Secondary | ICD-10-CM | POA: Diagnosis not present

## 2024-07-12 HISTORY — PX: INTERSTIM IMPLANT REMOVAL: SHX5131

## 2024-07-12 SURGERY — REMOVAL, NEUROSTIMULATOR, SACRAL
Anesthesia: Monitor Anesthesia Care

## 2024-07-12 MED ORDER — CEFAZOLIN SODIUM-DEXTROSE 2-4 GM/100ML-% IV SOLN
2.0000 g | INTRAVENOUS | Status: AC
  Administered 2024-07-12: 2 g via INTRAVENOUS
  Filled 2024-07-12: qty 100

## 2024-07-12 MED ORDER — PROPOFOL 10 MG/ML IV BOLUS
INTRAVENOUS | Status: AC
  Filled 2024-07-12: qty 20

## 2024-07-12 MED ORDER — AMISULPRIDE (ANTIEMETIC) 5 MG/2ML IV SOLN: 10.0000 mg | Freq: Once | INTRAVENOUS | Status: DC | PRN

## 2024-07-12 MED ORDER — PROPOFOL 500 MG/50ML IV EMUL
INTRAVENOUS | Status: AC
  Filled 2024-07-12: qty 50

## 2024-07-12 MED ORDER — MIDAZOLAM HCL 2 MG/2ML IJ SOLN
INTRAMUSCULAR | Status: AC
  Filled 2024-07-12: qty 2

## 2024-07-12 MED ORDER — FENTANYL CITRATE (PF) 100 MCG/2ML IJ SOLN
INTRAMUSCULAR | Status: AC
  Filled 2024-07-12: qty 2

## 2024-07-12 MED ORDER — LIDOCAINE-EPINEPHRINE (PF) 1 %-1:200000 IJ SOLN
INTRAMUSCULAR | Status: DC | PRN
  Administered 2024-07-12: 18 mL

## 2024-07-12 MED ORDER — PROPOFOL 10 MG/ML IV BOLUS
INTRAVENOUS | Status: DC | PRN
  Administered 2024-07-12 (×5): 20 mg via INTRAVENOUS

## 2024-07-12 MED ORDER — CEFAZOLIN SODIUM-DEXTROSE 1-4 GM/50ML-% IV SOLN
1.0000 g | INTRAVENOUS | Status: DC
  Filled 2024-07-12: qty 50

## 2024-07-12 MED ORDER — KETOROLAC TROMETHAMINE 15 MG/ML IJ SOLN
INTRAMUSCULAR | Status: AC
  Filled 2024-07-12: qty 1

## 2024-07-12 MED ORDER — KETOROLAC TROMETHAMINE 15 MG/ML IJ SOLN
15.0000 mg | Freq: Once | INTRAMUSCULAR | Status: AC | PRN
  Administered 2024-07-12: 15 mg via INTRAVENOUS

## 2024-07-12 MED ORDER — FENTANYL CITRATE (PF) 50 MCG/ML IJ SOSY
25.0000 ug | PREFILLED_SYRINGE | INTRAMUSCULAR | Status: DC | PRN
  Administered 2024-07-12: 50 ug via INTRAVENOUS

## 2024-07-12 MED ORDER — LIDOCAINE-EPINEPHRINE (PF) 1 %-1:200000 IJ SOLN
INTRAMUSCULAR | Status: AC
  Filled 2024-07-12: qty 30

## 2024-07-12 MED ORDER — ACETAMINOPHEN 10 MG/ML IV SOLN
INTRAVENOUS | Status: AC
  Filled 2024-07-12: qty 100

## 2024-07-12 MED ORDER — ORAL CARE MOUTH RINSE: 15.0000 mL | Freq: Once | OROMUCOSAL | Status: DC

## 2024-07-12 MED ORDER — FENTANYL CITRATE (PF) 50 MCG/ML IJ SOSY
PREFILLED_SYRINGE | INTRAMUSCULAR | Status: AC
  Filled 2024-07-12: qty 1

## 2024-07-12 MED ORDER — MIDAZOLAM HCL (PF) 2 MG/2ML IJ SOLN
INTRAMUSCULAR | Status: DC | PRN
Start: 2024-07-12 — End: 2024-07-12
  Administered 2024-07-12: 2 mg via INTRAVENOUS

## 2024-07-12 MED ORDER — ONDANSETRON HCL 4 MG/2ML IJ SOLN: 4.0000 mg | Freq: Once | INTRAMUSCULAR | Status: DC | PRN

## 2024-07-12 MED ORDER — ONDANSETRON HCL 4 MG/2ML IJ SOLN
INTRAMUSCULAR | Status: AC
  Filled 2024-07-12: qty 2

## 2024-07-12 MED ORDER — HYDROCODONE-ACETAMINOPHEN 5-325 MG PO TABS: 1.0000 | ORAL_TABLET | Freq: Four times a day (QID) | ORAL | 0 refills | Status: AC | PRN

## 2024-07-12 MED ORDER — FENTANYL CITRATE (PF) 100 MCG/2ML IJ SOLN
INTRAMUSCULAR | Status: DC | PRN
  Administered 2024-07-12 (×4): 25 ug via INTRAVENOUS

## 2024-07-12 MED ORDER — ACETAMINOPHEN 10 MG/ML IV SOLN
1000.0000 mg | Freq: Once | INTRAVENOUS | Status: DC | PRN
  Administered 2024-07-12: 1000 mg via INTRAVENOUS

## 2024-07-12 MED ORDER — LACTATED RINGERS IV SOLN: INTRAVENOUS | Status: DC

## 2024-07-12 MED ORDER — PROPOFOL 500 MG/50ML IV EMUL
INTRAVENOUS | Status: DC | PRN
  Administered 2024-07-12: 75 ug/kg/min via INTRAVENOUS

## 2024-07-12 MED ORDER — CHLORHEXIDINE GLUCONATE 0.12 % MT SOLN: 15.0000 mL | Freq: Once | OROMUCOSAL | Status: DC

## 2024-07-12 SURGICAL SUPPLY — 25 items
BAG COUNTER SPONGE SURGICOUNT (BAG) IMPLANT
CHLORAPREP W/TINT 26 (MISCELLANEOUS) ×1 IMPLANT
DERMABOND ADVANCED .7 DNX12 (GAUZE/BANDAGES/DRESSINGS) ×1 IMPLANT
DRAPE 3/4 80X56 (DRAPES) ×1 IMPLANT
DRAPE INCISE 23X17 STRL (DRAPES) ×1 IMPLANT
DRAPE LAPAROSCOPIC ABDOMINAL (DRAPES) ×1 IMPLANT
DRSG TEGADERM 2-3/8X2-3/4 SM (GAUZE/BANDAGES/DRESSINGS) IMPLANT
DRSG TEGADERM 4X10 (GAUZE/BANDAGES/DRESSINGS) ×1 IMPLANT
DRSG TEGADERM 4X4.75 (GAUZE/BANDAGES/DRESSINGS) IMPLANT
DRSG TELFA 3X8 NADH STRL (GAUZE/BANDAGES/DRESSINGS) ×1 IMPLANT
GAUZE 4X4 16PLY ~~LOC~~+RFID DBL (SPONGE) ×1 IMPLANT
GLOVE SURG LX STRL 7.5 STRW (GLOVE) ×1 IMPLANT
GOWN STRL REUS W/ TWL XL LVL3 (GOWN DISPOSABLE) ×1 IMPLANT
KIT BASIN OR (CUSTOM PROCEDURE TRAY) ×1 IMPLANT
NDL HYPO 25X1 1.5 SAFETY (NEEDLE) IMPLANT
NEEDLE HYPO 25X1 1.5 SAFETY (NEEDLE) IMPLANT
NS IRRIG 1000ML POUR BTL (IV SOLUTION) ×1 IMPLANT
PACK GENERAL/GYN (CUSTOM PROCEDURE TRAY) ×1 IMPLANT
SPIKE FLUID TRANSFER (MISCELLANEOUS) IMPLANT
SUT MNCRL AB 4-0 PS2 18 (SUTURE) ×1 IMPLANT
SUT VIC AB 2-0 UR6 27 (SUTURE) ×2 IMPLANT
SUT VIC AB 3-0 SH 27XBRD (SUTURE) ×2 IMPLANT
SUT VIC AB 4-0 PS2 27 (SUTURE) ×1 IMPLANT
SYR CONTROL 10ML LL (SYRINGE) IMPLANT
TOWEL OR 17X26 10 PK STRL BLUE (TOWEL DISPOSABLE) ×2 IMPLANT

## 2024-07-12 NOTE — Anesthesia Procedure Notes (Signed)
 Procedure Name: MAC Date/Time: 07/12/2024 1:08 PM  Performed by: Dasie Nena PARAS, CRNAPre-anesthesia Checklist: Patient identified, Emergency Drugs available, Suction available, Patient being monitored and Timeout performed Oxygen Delivery Method: Simple face mask Placement Confirmation: positive ETCO2

## 2024-07-12 NOTE — H&P (Signed)
 Patient had peripheral nerve evaluation October 11, 2021. Prior she had leakage without awareness bedwetting and urge incontinence from refractory overactive bladder. She had very mild stress incontinence with a leak point pressure greater than 116 cm of water. She tried multiple medications noted.   According to the [employer] diary she went from 37 leaks to 2 leaks and 7 pads to 2. She went from 2 bowel accidents to 0. Her frequency was decreased by 65%. She reported excellent benefit from regarding all 3 overactive bladder symptoms to my nurse   Patient kept a beautiful diary. Frequency more than 50% improved. No more bedwetting. No leakage with awareness. She had little bit of dampness twice at night. She is leaked almost none with little bit of dampness during the day but almost complete continent and very pleased   Skin looked good. Both leads were removed in total. She like to be scheduled for permanent implant   She has some burning especially on the right side on removing the leads.   She may need to delay her surgery due to her grandson being born. She wants is on the left side LEFTR SIDE  We went over activity levels again no heavy straining or swimming for 2 weeks   12/10/2021: 63 year old female who had InterStim placed approximately 2 weeks ago presents today for follow-up and InterStim programming discussion. She is currently on program 3 at 0.6. She states she has had some leakage although it is still improved from her baseline. Her leakage occurs mostly with change of position. She denies constipation, dysuria, fevers, chills. She has not adjusted her InterStim. Her blood pressure medication was recently increased. She has not had any fevers or chills.   03/10/2022: 63 year old female who presents today for follow-up. She is currently using InterStim and reports that it has greatly reduced her pad usage down to 2 to 3/day. She very rarely leaks. Her main concern today is redness and  erythema underneath her belly but also occasionally has this foul smell. She states her sugar has been elevated lately and she is going to see an endocrinologist to get this under control.   04/18/2023: Emma [name] presents today for yearly office visit regarding InterStim. She reports her urinary symptoms are much improved and she is happy with her choice of InterStim. She is able to adjust her device on her own and has no questions regarding this today. She has had some fecal incontinence which has been bothersome to her. She states she has more good days than bad. However, the fecal incontinence is a bit worse than it had been in the past. She does have a GI physician.   12/30/2023: 63 year old female who presents today with concerns of worsening urinary incontinence. She has InterStim which was placed in 2023. She has done well with this device. Since she made this appointment, she has increased the amplitude on her device and reports that her urgency and frequency have resolved.    July 12, 2024 Patient has been having pain with her InterStim.  The pain went away when she turned it off.  She would like to have it removed.  Pros cons risk discussed with patient   ALLERGIES: Codeine    MEDICATIONS: hydroCHLOROthiazide 12.5 MG Capsule  Omeprazole 20 MG Tablet Delayed Release  Cyclobenzaprine HCl 10 MG Tablet  DULoxetine  HCl 20 MG Capsule Delayed Release Particles  Estradiol  Fluticasone Propionate 50 MCG/ACT Suspension  Levothyroxine   Losartan  Potassium 100 MG Tablet tablet As Directed  Lunesta   3 MG Tablet  Meloxicam 15 MG Tablet  Modafinil  100 MG Tablet  Prevacid SoluTab 30 MG Tablet Delayed Release Disintegrating  SUMAtriptan Succinate 25 MG Tablet  Vitamin B12  Voltaren Arthritis Pain     GU PSH: Complex cystometrogram, w/ void pressure and urethral pressure profile studies, any technique - 07/03/2021 Complex Uroflow - 07/03/2021 Cystoscopy - 2023 Emg surf Electrd - 07/03/2021 Inject  For cystogram - 07/03/2021 Interstim Stage 1 - 2023, 2023 Interstim Stage 2 Generator - 2023 Intrabd voidng Press - 07/03/2021     NON-GU PSH: No Non-GU PSH    GU PMH: Mixed incontinence - 04/17/2023, - 07/26/2021, - 07/03/2021, - 2022 Incontinence w/o Sensation - 03/10/2022, - 2023, - 2023, - 2023, - 07/26/2021, - 2022 Urinary Frequency - 2023, - 07/26/2021, - 2022 Nocturia - 2022      PMH Notes: Stomach Ulcers   NON-GU PMH: Erythema intertrigo - 03/10/2022 Depression - 2023 Anxiety GERD Hypercholesterolemia Hypertension Hypothyroidism Sleep Apnea    FAMILY HISTORY: Cancer - Other, Runs in Family Death In The Family Father - Other Death In The Family Mother - Other Diabetes - Runs in Family heart - Mother Hepatitis - Runs in Family Stomach problems - Runs in Family   SOCIAL HISTORY: Marital Status: Married Preferred Language: English; Ethnicity: Not Hispanic Or Latino; Race: White Current Smoking Status: Patient does not smoke anymore. Has not smoked since 05/25/2008. Smoked for 25 years. Smoked 1 pack per day.   Tobacco Use Assessment Completed: Used Tobacco in last 30 days? Does not use smokeless tobacco. Does not use drugs. Drinks 1 caffeinated drink per day.    REVIEW OF SYSTEMS:    GU Review Female:   Patient reports leakage of urine. Patient denies frequent urination, hard to postpone urination, burning /pain with urination, get up at night to urinate, stream starts and stops, trouble starting your stream, have to strain to urinate, and being pregnant.  Gastrointestinal (Upper):   Patient denies nausea, vomiting, and indigestion/ heartburn.  Gastrointestinal (Lower):   Patient denies diarrhea and constipation.  Constitutional:   Patient denies fever, night sweats, weight loss, and fatigue.  Skin:   Patient denies skin rash/ lesion and itching.  Eyes:   Patient denies blurred vision and double vision.  Ears/ Nose/ Throat:   Patient denies sore throat and sinus  problems.  Hematologic/Lymphatic:   Patient denies easy bruising and swollen glands.  Cardiovascular:   Patient denies leg swelling and chest pains.  Respiratory:   Patient denies cough and shortness of breath.  Endocrine:   Patient denies excessive thirst.  Musculoskeletal:   Patient denies back pain and joint pain.  Neurological:   Patient denies headaches and dizziness.  Psychologic:   Patient denies depression and anxiety.   VITAL SIGNS: None   MULTI-SYSTEM PHYSICAL EXAMINATION:    Constitutional: Well-nourished. No physical deformities. Normally developed. Good grooming.  Cardiovascular: Normal temperature, normal extremity pulses, no swelling, no varicosities.  Skin: No paleness, no jaundice, no cyanosis. No lesion, no ulcer, no rash.  Neurologic / Psychiatric: Oriented to time, oriented to place, oriented to person. No depression, no anxiety, no agitation.  Gastrointestinal: No mass, no tenderness, no rigidity, non obese abdomen.     Complexity of Data:  Source Of History:  Patient  Records Review:   Previous Doctor Records, Previous Patient Records  Urine Test Review:   Urinalysis   12/30/23  Urinalysis  Urine Appearance Clear   Urine Color Yellow   Urine Glucose Neg mg/dL  Urine Bilirubin Neg mg/dL  Urine Ketones Neg mg/dL  Urine Specific Gravity 1.025   Urine Blood Neg ery/uL  Urine pH <=5.0   Urine Protein Trace mg/dL  Urine Urobilinogen 0.2 mg/dL  Urine Nitrites Neg   Urine Leukocyte Esterase Neg leu/uL   PROCEDURES:          Visit Complexity - G2211          Urinalysis Dipstick Dipstick Cont'd  Color: Yellow Bilirubin: Neg mg/dL  Appearance: Clear Ketones: Neg mg/dL  Specific Gravity: 8.974 Blood: Neg ery/uL  pH: <=5.0 Protein: Trace mg/dL  Glucose: Neg mg/dL Urobilinogen: 0.2 mg/dL    Nitrites: Neg    Leukocyte Esterase: Neg leu/uL    ASSESSMENT:      ICD-10 Details  1 GU:   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            Schedule

## 2024-07-12 NOTE — Interval H&P Note (Signed)
 History and Physical Interval Note:  07/12/2024 12:08 PM  Emma [name]  has presented today for surgery, with the diagnosis of URGE INCONTINENCE.  The various methods of treatment have been discussed with the patient and family. After consideration of risks, benefits and other options for treatment, the patient has consented to  Procedure(s): REMOVAL, NEUROSTIMULATOR, SACRAL (N/A) as a surgical intervention.  The patient's history has been reviewed, patient examined, no change in status, stable for surgery.  I have reviewed the patient's chart and labs.  Questions were answered to the patient's satisfaction.     Batool Majid A Laval Cafaro

## 2024-07-12 NOTE — Discharge Instructions (Signed)
 I have reviewed discharge instructions in detail with the patient. They will follow-up with me or their physician as scheduled. My nurse will also be calling the patients as per protocol.

## 2024-07-12 NOTE — Op Note (Signed)
 Preoperative diagnosis: Refractory urgency incontinence and malfunctioning InterStim Postoperative diagnosis: Refractory urgency incontinence and malfunctioning InterStim Surgery: Removal of InterStim and fluoroscopy Surgeon: Dr. Glendia Debhora Titus  The patient was prepped and draped in usual fashion for the above diagnosis.  Fluoroscopy was utilized.  Was easy to see the right midline incision for the lead in the left incision for the IPG.  I instilled 10 cc of lidocaine  epinephrine  mixture left upper buttock.  Scalpel was used to dissect through skin and subcutaneous tissue followed by cutting current.  IPG was easily removed.  Hemostat was placed across the lead.  Lead was cut and IPG was removed and discarded  Pseudocapsule was opened broadly in the back.  By using skin markings, skin dimpling with pulling on the lead and fluoroscopy I marked the position of the lead.  I made a 2-1/2 to 3 cm incision.  I used scalpel.  I dissected down and easily found the lead delivering it from lateral to medial.  I did some finger dissection and sharp dissection down to the bony table  A lateral fluoroscopy I used a heavy hemostat with my usual technique and remove the entire lead in total  Left buttock incision was closed with 3-0 Vicryl subcutaneous followed by 4-0 Monocryl.  Short midline incision was closed with one 3-0 Vicryl subcutaneous followed by interrupted 4-0 Vicryl.  Sterile dressing was applied.  She will be followed as per protocol

## 2024-07-12 NOTE — Anesthesia Preprocedure Evaluation (Addendum)
 Anesthesia Evaluation  Patient identified by MRN, date of birth, ID band Patient awake    Reviewed: Allergy & Precautions, NPO status , Patient's Chart, lab work & pertinent test results  History of Anesthesia Complications (+) PONV and history of anesthetic complications  Airway Mallampati: II  TM Distance: >3 FB Neck ROM: Full    Dental  (+) Chipped,    Pulmonary asthma , sleep apnea , former smoker   Pulmonary exam normal        Cardiovascular hypertension, Pt. on medications Normal cardiovascular exam     Neuro/Psych  PSYCHIATRIC DISORDERS Anxiety  Bipolar Disorder   negative neurological ROS     GI/Hepatic Neg liver ROS,GERD  Medicated and Controlled,,  Endo/Other  Hypothyroidism  Patient on GLP-1 Agonist  Renal/GU      Musculoskeletal negative musculoskeletal ROS (+)    Abdominal  (+) + obese  Peds  Hematology negative hematology ROS (+)   Anesthesia Other Findings URGE INCONTINENCE  Reproductive/Obstetrics                              Anesthesia Physical Anesthesia Plan  ASA: 3  Anesthesia Plan: MAC   Post-op Pain Management:    Induction:   PONV Risk Score and Plan: 4 or greater and Ondansetron , Dexamethasone , Propofol  infusion, Midazolam  and Treatment may vary due to age or medical condition  Airway Management Planned: Nasal Cannula  Additional Equipment:   Intra-op Plan:   Post-operative Plan:   Informed Consent: I have reviewed the patients History and Physical, chart, labs and discussed the procedure including the risks, benefits and alternatives for the proposed anesthesia with the patient or authorized representative who has indicated his/her understanding and acceptance.     Dental advisory given  Plan Discussed with: CRNA  Anesthesia Plan Comments:          Anesthesia Quick Evaluation

## 2024-07-12 NOTE — Transfer of Care (Signed)
 Immediate Anesthesia Transfer of Care Note  Patient: Emma [name]  Procedure(s) Performed: REMOVAL, NEUROSTIMULATOR, SACRAL  Patient Location: PACU  Anesthesia Type:MAC  Level of Consciousness: awake, alert , and patient cooperative  Airway & Oxygen Therapy: Patient Spontanous Breathing and Patient connected to face mask oxygen  Post-op Assessment: Report given to RN and Post -op Vital signs reviewed and stable  Post vital signs: Reviewed and stable  Last Vitals:  Vitals Value Taken Time  BP 127/67 07/12/24 14:00  Temp 36.6 C 07/12/24 13:57  Pulse 96 07/12/24 14:03  Resp 12 07/12/24 14:03  SpO2 94 % 07/12/24 14:03  Vitals shown include unfiled device data.  Last Pain:  Vitals:   07/12/24 1357  TempSrc:   PainSc: 5          Complications: No notable events documented.

## 2024-07-13 ENCOUNTER — Encounter (HOSPITAL_COMMUNITY): Payer: Self-pay | Admitting: Urology

## 2024-07-13 NOTE — Anesthesia Postprocedure Evaluation (Signed)
 Anesthesia Post Note  Patient: Emma [name]  Procedure(s) Performed: REMOVAL, NEUROSTIMULATOR, SACRAL     Patient location during evaluation: PACU Anesthesia Type: MAC Level of consciousness: awake Pain management: pain level controlled Vital Signs Assessment: post-procedure vital signs reviewed and stable Respiratory status: spontaneous breathing, nonlabored ventilation and respiratory function stable Cardiovascular status: blood pressure returned to baseline and stable Postop Assessment: no apparent nausea or vomiting Anesthetic complications: no   No notable events documented.  Last Vitals:  Vitals:   07/12/24 1452 07/12/24 1455  BP: (!) 171/92 (!) 164/84  Pulse: 93   Resp:    Temp:    SpO2: 100%     Last Pain:  Vitals:   07/12/24 1455  TempSrc:   PainSc: 4                  Keyontae Huckeby P Marlowe Lawes

## 2024-07-25 ENCOUNTER — Ambulatory Visit: Admitting: Physician Assistant

## 2024-07-29 ENCOUNTER — Encounter: Admitting: Internal Medicine

## 2024-08-03 ENCOUNTER — Encounter: Payer: Self-pay | Admitting: Physician Assistant

## 2024-08-03 ENCOUNTER — Ambulatory Visit (INDEPENDENT_AMBULATORY_CARE_PROVIDER_SITE_OTHER): Admitting: Physician Assistant

## 2024-08-03 DIAGNOSIS — F5105 Insomnia due to other mental disorder: Secondary | ICD-10-CM | POA: Diagnosis not present

## 2024-08-03 DIAGNOSIS — F411 Generalized anxiety disorder: Secondary | ICD-10-CM | POA: Diagnosis not present

## 2024-08-03 DIAGNOSIS — F99 Mental disorder, not otherwise specified: Secondary | ICD-10-CM | POA: Diagnosis not present

## 2024-08-03 DIAGNOSIS — F319 Bipolar disorder, unspecified: Secondary | ICD-10-CM

## 2024-08-03 MED ORDER — SUNOSI 150 MG PO TABS: 150.0000 mg | ORAL_TABLET | ORAL | 0 refills | Status: AC

## 2024-08-03 MED ORDER — DULOXETINE HCL 20 MG PO CPEP: 40.0000 mg | ORAL_CAPSULE | Freq: Every day | ORAL | 3 refills | Status: DC

## 2024-08-03 MED ORDER — ESZOPICLONE 2 MG PO TABS: 2.0000 mg | ORAL_TABLET | Freq: Every evening | ORAL | 2 refills | Status: AC | PRN

## 2024-08-03 NOTE — Progress Notes (Signed)
 [facility] Med Check  Patient ID: Emma [name],  MRN: [id number]  PCP: Jordan, Julie M, NP  Date of Evaluation: 08/03/2024  Time spent:20 minutes  Chief Complaint:  Chief Complaint   Anxiety; Depression; Insomnia; Follow-up    HISTORY/CURRENT STATUS: HPI  For routine med check.  We restarted Sunosi  at the last visit because her insurance started paying for it.  It has been a secretary/administrator.  She does not take it every day but when she does it is very helpful.  And she only takes 1/2 pill.  Patient is able to enjoy things.  Energy and motivation are good most of the time now.  She sleeps better.  She does take the Lunesta  sometimes but does not need it every night.  No extreme sadness, tearfulness, or feelings of hopelessness.  ADLs and personal hygiene are normal.   Denies any changes in concentration, making decisions, or remembering things.  Appetite has not changed.  Weight is stable.  Anxiety is controlled.  She takes the Klonopin  when needed and it is effective.  No mania, delirium, AH/VH.  No SI/HI.  Individual Medical History/ Review of Systems: Changes? :No      Past medications for mental health diagnoses include: Cymbalta  causes sweating/hot flashes above 40 mg, Prozac worked for [employer], Effexor caused her to be a zombie, Wellbutrin caused anger, Klonopin , Lunesta , Zoloft, Ambien, Trazodone, Trileptal  caused severe nausea, Lamictal  caused insomnia, Latuda  caused h/a, had 'body heat not like hot flashes from menopause.' Sunosi  too expensive, Caplyta  made her 'feel like she's smoked pot all day' after 1 pill. Ritalin  caused fatigue, Modafinil , she tried Adderall many years ago w/o Rx.   Allergies: Latuda  [lurasidone ], Molds & smuts, Oxcarbazepine , Quercus robur, Codeine, Prednisone, and Statins  Current Medications:  Current Outpatient Medications:    cetirizine (ZYRTEC) 10 MG tablet, Take 10 mg by mouth daily. prn, Disp: , Rfl:    clonazePAM  (KLONOPIN ) 0.5 MG tablet, Take 1  tablet (0.5 mg total) by mouth 2 (two) times daily as needed for anxiety., Disp: 30 tablet, Rfl: 2   estradiol (ESTRACE) 2 MG tablet, Take 2 mg by mouth daily. , Disp: , Rfl:    fluticasone (FLONASE) 50 MCG/ACT nasal spray, Place 1 spray into both nostrils daily as needed for allergies., Disp: , Rfl:    hydrochlorothiazide (HYDRODIURIL) 12.5 MG tablet, Take 12.5 mg by mouth daily., Disp: , Rfl:    levothyroxine  (SYNTHROID ) 50 MCG tablet, Take 1 tablet (50 mcg total) by mouth daily before breakfast., Disp: 90 tablet, Rfl: 3   losartan  (COZAAR ) 50 MG tablet, Take 50 mg by mouth daily., Disp: , Rfl:    meloxicam (MOBIC) 15 MG tablet, Take 15 mg by mouth daily as needed for pain., Disp: , Rfl:    omeprazole (PRILOSEC) 40 MG capsule, Take 40 mg by mouth daily., Disp: , Rfl:    OVER THE COUNTER MEDICATION, Take 1 tablet by mouth daily at 12 noon. Calcium, Vitamin D, Vitamin K, and Vitamin B12, Disp: , Rfl:    promethazine  (PHENERGAN ) 25 MG tablet, Take 1 tablet (25 mg total) by mouth every 6 (six) hours as needed for nausea or vomiting., Disp: 30 tablet, Rfl: 0   SUMATRIPTAN SUCCINATE PO, Take 1 tablet by mouth daily as needed (migraines)., Disp: , Rfl:    tirzepatide (MOUNJARO) 12.5 MG/0.5ML Pen, Inject 12.5 mg into the skin once a week., Disp: , Rfl:    Vitamin E 400 units TABS, Take 400 Units by mouth daily., Disp: ,  Rfl:    DULoxetine  (CYMBALTA ) 20 MG capsule, Take 2 capsules (40 mg total) by mouth daily., Disp: 180 capsule, Rfl: 3   eszopiclone  (LUNESTA ) 2 MG TABS tablet, Take 1 tablet (2 mg total) by mouth at bedtime as needed for sleep. Take immediately before bedtime, Disp: 30 tablet, Rfl: 2   HYDROcodone -acetaminophen  (NORCO/VICODIN) 5-325 MG tablet, Take 1-2 tablets by mouth every 6 (six) hours as needed for moderate pain (pain score 4-6). (Patient not taking: Reported on 08/03/2024), Disp: 10 tablet, Rfl: 0   Solriamfetol  HCl (SUNOSI ) 150 MG TABS, Take 1 tablet (150 mg total) by mouth every  morning., Disp: 90 tablet, Rfl: 0   traMADol  (ULTRAM ) 50 MG tablet, Take 1-2 tablets (50-100 mg total) by mouth every 6 (six) hours as needed. (Patient not taking: Reported on 07/04/2024), Disp: 20 tablet, Rfl: 0   UNABLE TO FIND, CPAP (Patient not taking: No sig reported), Disp: , Rfl:    UNABLE TO FIND, Inspire implanted 04/21/2024., Disp: , Rfl:  Medication Side Effects: none  Family Medical/ Social History: Changes? none  MENTAL HEALTH EXAM:  There were no vitals taken for this visit.There is no height or weight on file to calculate BMI.  General Appearance: Casual, Well Groomed, and Obese  Eye Contact:  Good  Speech:  Clear and Coherent and Normal Rate  Volume:  Normal  Mood:  Euthymic  Affect:  Congruent  Thought Process:  Goal Directed and Descriptions of Associations: Circumstantial  Orientation:  Full (Time, Place, and Person)  Thought Content: Logical   Suicidal Thoughts:  No  Homicidal Thoughts:  No  Memory:  WNL  Judgement:  Good  Insight:  Good  Psychomotor Activity:  Normal  Concentration:  Concentration: Good and Attention Span: Good  Recall:  Good  Fund of Knowledge: Good  Language: Good  Assets:  Communication Skills Desire for Improvement Financial Resources/Insurance Housing Resilience Social Support Transportation  ADL's:  Intact  Cognition: WNL  Prognosis:  Good   GeneSight test results are on chart under media/scan.  DIAGNOSES:    ICD-10-CM   1. Bipolar I disorder (HCC)  F31.9     2. Generalized anxiety disorder  F41.1     3. Insomnia due to other mental disorder  F51.05    F99       Receiving Psychotherapy: No   RECOMMENDATIONS:  PDMP reviewed.   Last Klonopin  06/08/2024.  Sunosi  filled 06/08/2024.  Lunesta  filled 06/08/2024.   I provided approximately  20 minutes of face to face time during this encounter, including time spent before and after the visit in records review, medical decision making, counseling pertinent to today's visit,  and charting.   I am glad to see that she is doing better after restarting the Sunosi .  No changes need to be made.  Continue Klonopin  0.5 mg 1 p.o. twice daily as needed.  Take as sparingly as possible. Continue Cymbalta  20 mg, 2 p.o. qd. (Sweats too much at higher doses) Continue Lunesta   2 mg hs prn. (Rarely takes.) Continue Sunosi  150 mg, 1/2-1every morning. Continue vitamins as per med sheet. Return in 4 months.  Verneita Cooks, PA-C

## 2024-08-04 ENCOUNTER — Other Ambulatory Visit: Payer: Self-pay | Admitting: Physician Assistant

## 2024-08-04 NOTE — Telephone Encounter (Signed)
use

## 2024-08-05 NOTE — Progress Notes (Signed)
 "  Office Visit Note  Patient: Emma [name]             Date of Birth: [birth year]           MRN: [id number]             PCP: Jordan, Julie M, NP Referring: Beverley Evalene BIRCH, MD Visit Date: 08/19/2024 Occupation: Data Unavailable  Subjective:  Pain in multiple joints  History of Present Illness: Elinore Shults is a 63 y.o. female seen for the evaluation of polyarthralgia.  Patient states her symptoms started about 10 years ago with pain in bilateral knee joints.  Gradually she started having pain in her other joints involving her hands and her feet.  She underwent right total knee replacement in 2017 and left total knee replacement in 2019 and [place] .  She started having pain in her hands with the stiffness and swelling which gradually got worse.  She has been taking meloxicam on as needed basis.  She recalls having injections in her MTPs while she was in [place] .  She had surgery on her left second MTP joint.  She has been having increased pain in her hips for the last 1 year.  She was under care of Dr. Beverley who did x-rays and MRI per patient which was negative.  She had cortisone injection to her trochanteric bursa about 8 months ago which did not help much.  She states she used to do swimming water aerobics and walking which she is unable to do now due to pain and discomfort.  She takes care of her grandchild.  None of the other joints are painful.  She denies any history of oral ulcers, nasal ulcers, malar rash, photosensitivity, Raynaud's, lymphadenopathy. There is no family history of autoimmune disease.  She is retired psychologist, occupational.  She is married, gravida 2, para 2 with no history of preeclampsia or DVTs.  She does not drink alcohol.  She smoked from age 51 until 22 1 pack/day.  There is no family history of autoimmune disease.    Activities of Daily Living:  Patient reports morning stiffness for 1 hour.   Patient Reports nocturnal pain.  Difficulty dressing/grooming: Denies Difficulty  climbing stairs: Denies Difficulty getting out of chair: Reports Difficulty using hands for taps, buttons, cutlery, and/or writing: Reports  Review of Systems  Constitutional:  Positive for fatigue.  HENT:  Positive for mouth dryness. Negative for mouth sores.   Eyes:  Positive for dryness.  Respiratory:  Negative for shortness of breath.   Cardiovascular:  Negative for chest pain and palpitations.  Gastrointestinal:  Positive for constipation. Negative for blood in stool and diarrhea.  Endocrine: Negative for increased urination.  Genitourinary:  Negative for involuntary urination.  Musculoskeletal:  Positive for joint pain, gait problem, joint pain, joint swelling, myalgias, muscle weakness, morning stiffness, muscle tenderness and myalgias.  Skin:  Positive for hair loss. Negative for color change, rash and sensitivity to sunlight.  Allergic/Immunologic: Negative for susceptible to infections.  Neurological:  Positive for headaches. Negative for dizziness.  Hematological:  Negative for swollen glands.  Psychiatric/Behavioral:  Positive for depressed mood and sleep disturbance. The patient is nervous/anxious.     PMFS History:  Patient Active Problem List   Diagnosis Date Noted   Primary hypertension 09/03/2021   Affective psychosis, bipolar (HCC) 07/22/2020   Irritability and anger 07/22/2020   Generalized anxiety disorder 07/22/2020   Insomnia due to other mental disorder 07/22/2020   Acquired hypothyroidism 03/13/2020   Periumbilical  abdominal pain 03/13/2020    Past Medical History:  Diagnosis Date   Anxiety    Asthma    GERD (gastroesophageal reflux disease)    Hypertension    Hypothyroidism    PONV (postoperative nausea and vomiting)    Sleep apnea    non compliant with CPAP   Thyroid  disease     Family History  Problem Relation Age of Onset   Breast cancer Mother 59   Dementia Mother    Drug abuse Mother    Stomach cancer Father    Heart disease Father     Arthritis Father    Breast cancer Sister 87   Hypertension Sister    Obesity Sister    Heart Problems Sister    Hypertension Sister    Diabetes Sister    Heart Problems Sister    Obesity Sister    Hypertension Brother    Diabetes Brother    Heart Problems Brother    Drug abuse Brother    Obesity Brother    Hypertension Brother    Heart Problems Brother    Breast cancer Paternal Grandmother    Healthy Daughter    Healthy Son    Past Surgical History:  Procedure Laterality Date   ABDOMINAL HYSTERECTOMY     bladder stimulator  11/2021   bladder stimulator removal   04/11/2024   CARPAL TUNNEL RELEASE Bilateral    CHOLECYSTECTOMY     COLONOSCOPY     DRUG INDUCED ENDOSCOPY Bilateral 03/03/2024   Procedure: DRUG INDUCED SLEEP ENDOSCOPY;  Surgeon: Carlie Whelpley, MD;  Location: [facility] END[facility];  Service: ENT;  Laterality: Bilateral;   EYE SURGERY Bilateral    laser   IMPLANTATION OF HYPOGLOSSAL NERVE STIMULATOR Right 04/21/2024   Procedure: INSERTION, HYPOGLOSSAL NERVE STIMULATOR;  Surgeon: Carlie Vizzini, MD;  Location: [facility];  Service: ENT;  Laterality: Right;   inspire for sleep apnea  04/21/2024   INTERSTIM IMPLANT REMOVAL N/A 07/12/2024   Procedure: REMOVAL, NEUROSTIMULATOR, SACRAL;  Surgeon: Gaston Hamilton, MD;  Location: [facility];  Service: Urology;  Laterality: N/A;   JOINT REPLACEMENT     NASAL SINUS SURGERY  2006   OSTEOTOMY Left 2007   foot   ROTATOR CUFF REPAIR Right    SHOULDER ARTHROSCOPY WITH SUBACROMIAL DECOMPRESSION, ROTATOR CUFF REPAIR AND BICEP TENDON REPAIR Left 04/01/2019   Procedure: LEFT SHOULDER ARTHROSCOPY WITH SUBACROMIAL DECOMPRESSION, DISTAL CLAVICLE EXCISION, BICEP TENODESIS, ROTATOR CUFF REPAIR, spinal needle decrompression of glenoid cyst;  Surgeon: Beverley Evalene BIRCH, MD;  Location: [facility];  Service: Orthopedics;  Laterality: Left;   TOTAL KNEE ARTHROPLASTY Bilateral    Social History[1] Social History   Social  History Narrative   Grew up in [place] . Both parents were in the home 'when they weren't separated.' Was never abused but 'mom was mean.' Dad worked in haematologist, teacher, english as a foreign language. Mom worked in personnel officer, went back to school for nursing degree. Mom abused drugs.    First husband was her true love, but 'didn't know how to keep his pants on.' Current husband is just a friend. Both in [employer].    Kids are 65, and 41.    She had some college but no degree. Worked in photographer for 23 years, and bartending at night. [employer], cleaned houses. Did a lot of things to keep herself busy. Not working right now.    She travels some now. One child lives local. The other in [place].      Caffeine-6 oz  of Pepsi.   Legal-none.   Religious-no specific beliefs     Immunization History  Administered Date(s) Administered   PFIZER(Purple Top)SARS-COV-2 Vaccination 12/17/2019, 01/09/2020, 07/23/2020     Objective: Vital Signs: BP 134/78 (BP Location: Right Arm, Patient Position: Sitting, Cuff Size: Normal)   Pulse 88   Temp (!) 97.5 F (36.4 C)   Resp 16   Ht 5' 3 (1.6 m)   Wt 172 lb (78 kg)   BMI 30.47 kg/m    Physical Exam Vitals and nursing note reviewed.  Constitutional:      Appearance: She is well-developed.  HENT:     Head: Normocephalic and atraumatic.  Eyes:     Conjunctiva/sclera: Conjunctivae normal.  Cardiovascular:     Rate and Rhythm: Normal rate and regular rhythm.     Heart sounds: Normal heart sounds.  Pulmonary:     Effort: Pulmonary effort is normal.     Breath sounds: Normal breath sounds.  Abdominal:     General: Bowel sounds are normal.     Palpations: Abdomen is soft.  Musculoskeletal:     Cervical back: Normal range of motion.  Lymphadenopathy:     Cervical: No cervical adenopathy.  Skin:    General: Skin is warm and dry.     Capillary Refill: Capillary refill takes less than 2 seconds.  Neurological:     Mental Status: She is alert and oriented to person,  place, and time.  Psychiatric:        Behavior: Behavior normal.      Musculoskeletal Exam: Cervical spine was in good range of motion.  She had limited mobility in her lumbar spine due to tight hamstrings.  Shoulders, elbows, wrist joints with good range of motion.  She had bilateral PIP and DIP thickening with subluxation of some of the DIP joints.  Hip joints were in good range of motion.  She had tenderness over bilateral trochanteric bursa.  Bilateral knee joints are replaced.  She has surgical scar over her left second MTP.  PIP and DIP thickening was noted.  Right hallux valgus deformity was noted.  No synovitis was noted.  CDAI Exam: CDAI Score: -- Patient Global: --; Provider Global: -- Swollen: --; Tender: -- Joint Exam 08/19/2024   No joint exam has been documented for this visit   There is currently no information documented on the homunculus. Go to the Rheumatology activity and complete the homunculus joint exam.  Investigation: No additional findings.  Imaging: No results found.   Recent Labs: Lab Results  Component Value Date   WBC 9.0 07/07/2024   HGB 15.1 (H) 07/07/2024   PLT 369 07/07/2024   NA 137 07/07/2024   K 4.1 07/07/2024   CL 101 07/07/2024   CO2 26 07/07/2024   GLUCOSE 93 07/07/2024   BUN 14 07/07/2024   CREATININE 0.67 07/07/2024   BILITOT 0.6 02/05/2022   ALKPHOS 65 02/05/2022   AST 27 02/05/2022   ALT 25 02/05/2022   PROT 7.7 02/05/2022   ALBUMIN 3.5 02/05/2022   CALCIUM 9.3 07/07/2024   GFRAA >60 05/19/2020    Speciality Comments: No specialty comments available.  Procedures:  Large Joint Inj: bilateral greater trochanter on 08/19/2024 10:19 AM Indications: pain Details: 27 G 1.5 in needle, lateral approach  Arthrogram: No  Medications (Right): 1.5 mL lidocaine  1 %; 40 mg triamcinolone  acetonide 40 MG/ML Medications (Left): 1.5 mL lidocaine  1 %; 40 mg triamcinolone  acetonide 40 MG/ML Outcome: tolerated well, no immediate  complications  Risk of infection, tendon injury, nerve injury, hypopigmentation and dermal atrophy were discussed. Procedure, treatment alternatives, risks and benefits explained, specific risks discussed. Consent was given by the patient. Immediately prior to procedure a time out was called to verify the correct patient, procedure, equipment, support staff and site/side marked as required. Patient was prepped and draped in the usual sterile fashion.     Allergies: Latuda  [lurasidone ], Molds & smuts, Oxcarbazepine , Quercus robur, White oak, Codeine, Prednisone, and Statins   Assessment / Plan:     Visit Diagnoses: Polyarthralgia -she complains of pain and discomfort in multiple joints over the last 10 years.  She had bilateral total knee replacement.  She complains of ongoing discomfort in her hands and intermittent discomfort in her left shoulder where she had surgery.  She also had surgery on her left second toe.  She has discomfort in her feet.  Her worst pain is in her bilateral trochanteric bursa.  She had cortisone injections about 8 months ago.  I will obtain following labs to evaluate for any underlying autoimmune disease.  Plan: Sedimentation rate, Uric acid, Rheumatoid factor, Cyclic citrul peptide antibody, IgG, ANA  Pain in both hands -she complains of morning stiffness, pain and swelling in her hands.  No synovitis was noted.  Bilateral PIP and DIP thickening with subluxation of some of the DIP joints was noted.  A handout on hand exercises was given.  Plan: XR Hand 2 View Right, XR Hand 2 View Left.  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  First MTP, PIP and DIP narrowing was noted.  No intertarsal, tibiotalar or subtalar joint space narrowing was noted.  No erosive changes were noted.  Impression: These findings are suggestive of osteoarthritis of the feet.  Trochanteric bursitis of both hips -patient reports having bilateral trochanteric bursa injections by Dr.  Beverley which helped only for short time.  She had extensive workup of her hips including x-rays and MRI per patient which were negative.  Patient requested repeat cortisone injections today.  After informed consent was obtained and side effects were discussed bilateral trochanter bursa were injected with lidocaine  and Kenalog .  I offered physical therapy which she declined.  A handout on trochanteric bursa exercise was given.  She plans to join the gym and do water aerobics and stretching.   S/P TKR (total knee replacement), bilateral - Right total knee replacement 2017, left total knee replacement 2019 in [place] .  She continues to have some discomfort.  Pain in both feet -she continues to have pain and discomfort in her both feet.  She had left second MTP surgery in the past.  She has hallux valgus.  No synovitis was noted.  Plan: XR Foot 2 Views Right, XR Foot 2 Views Left.  The screw was noted in the left first metatarsal head.  X-ray findings with history of osteoarthritis.  Other fatigue -she complains of ongoing fatigue.  Plan: CBC with Differential/Platelet, Comprehensive metabolic panel with GFR  Myalgia -complains of pain and discomfort in her lower extremity muscles.  Plan: CK  Primary hypertension-blood pressure was elevated at 154/82 and repeat blood pressure was normal.  She was advised to monitor blood pressure closely.  Acquired hypothyroidism  Restless leg syndrome  Insomnia due to other mental disorder  Generalized anxiety disorder  Bipolar disorder, current episode mixed, severe, without psychotic features (HCC)  OSA (obstructive sleep apnea) - uses [facility]  Former smoker-she smoked 1 pack/day since age 38 and quit smoking in 2010.  Orders: Orders Placed This Encounter  Procedures   Large Joint Inj   XR Foot 2 Views Right   XR Foot 2 Views Left   XR Hand 2 View Right   XR Hand 2 View Left   CBC with Differential/Platelet   Comprehensive metabolic panel with  GFR   Sedimentation rate   CK   Uric acid   Rheumatoid factor   Cyclic citrul peptide antibody, IgG   ANA   No orders of the defined types were placed in this encounter.    Follow-Up Instructions: Return for Polyarthralgia, Osteoarthritis.   Maya Nash, MD  Note - This record has been created using Animal nutritionist.  Chart creation errors have been sought, but may not always  have been located. Such creation errors do not reflect on  the standard of medical care.     [1]  Social History Tobacco Use   Smoking status: Former    Current packs/day: 0.00    Types: Cigarettes    Quit date: 07/17/2009    Years since quitting: 15.1    Passive exposure: Past   Smokeless tobacco: Never  Vaping Use   Vaping status: Never Used  Substance Use Topics   Alcohol use: Yes    Comment: 1 q every 4 months.   Drug use: Never   "

## 2024-08-05 NOTE — Progress Notes (Signed)
 SUBJECTIVE   Patient ID: Emma [name] is a 63 y.o. (DOB 02-05-1961) female  Chief Complaint  Patient presents with   Hypertension   Patient presents today for follow up and medication review.   Hypertension Patient presents for hypertension follow up. Current antihypertensive medications reviewed. Tolerating medication well. ROS: No chest pain, shortness of breath, palpitations or worsening edema. BP Readings from Last 3 Encounters:  08/05/24 126/80  07/06/24 (!) 148/98  06/16/24 133/85   Emma [name] occasionally takes tramadol  for pain.  She has hand and leg pain.  She is requesting a refill.   The following portions of the patient's history were reviewed and updated as appropriate: allergies, current medications, past family history, past medical history, past social history, past surgical history and problem list.  Most recent lab results reviewed and discussed with patient.  REVIEW OF SYSTEMS: Reviewed and negative other than those pertinent positives noted in the HPI.  OBJECTIVE   Vitals:   08/05/24 0917  BP: 126/80  Pulse: 96  SpO2: 98%    Constitutional: Oriented to person, place and time. Appears well-developed and well nourished. HEENT: Normocephalic, atraumatic. Neck: No cervical adenopathy. No thyromegaly. Cardiovascular: Normal rate and rhythm.  Exam reveals no gallop and no friction rub.  No murmur heard.  Pulmonary/chest: Effort normal and breath sounds normal. No wheezes. No rales.  Skin: Warm and dry. No rashes noted. Patient is not diaphoretic. Extremities: No edema bilaterally.  Psychiatric: Normal mood and affect. Behavior normal. Judgement and thought content normal.   ASSESSMENT/PLAN   HTN- Stable and well controlled on current regimen. 2. Polyarthralgia (Primary) Refilled PRN tramadol  - traMADoL  (ULTRAM ) 50 mg tablet; Take 1-2 tablets (50-100 mg total) by mouth every 8 (eight) hours as needed for moderate pain (4-6).  Dispense: 20 tablet; Refill:  0  Encounter Medications[1] Risks, benefits, and alternatives of the medication(s) and treatment plan(s) were discussed, and she expressed understanding. Plan follow up as discussed or as needed. No barriers to treatment identified in this visit.    Electronically signed by: Julie Marie Jordan, NP 08/05/2024 9:23 AM       [1] Outpatient Encounter Medications as of 08/05/2024  Medication Sig Dispense Refill   albuterol HFA (PROVENTIL HFA;VENTOLIN HFA;PROAIR HFA) 90 mcg/actuation inhaler      alpha tocopherol (VITAMIN E) 268 mg (400 unit) capsule Take 400 Units by mouth Once Daily.     cetirizine (ZyrTEC) 10 mg tablet Take 10 mg by mouth.     cholecalciferol (VITAMIN D3) 5,000 unit (125 mcg) tab tablet Take 5,000 Units by mouth daily.     clonazePAM  (KlonoPIN ) 0.5 mg tablet Take 0.5 mg by mouth 2 (two) times a day as needed.     DULoxetine  (CYMBALTA ) 20 mg capsule Take 20 mg by mouth 2 (two) times a day.     estradioL (ESTRACE) 2 mg tablet Take 1 tablet (2 mg total) by mouth daily. 90 tablet 3   eszopiclone  (LUNESTA ) 2 mg tablet Take 1 tablet (2 mg total) by mouth daily. 30 tablet 3   hydroCHLOROthiazide (HYDRODIURIL) 12.5 mg capsule Take 12.5 mg by mouth daily.     levothyroxine  (SYNTHROID ) 50 mcg tablet Take 1 tablet (50 mcg total) by mouth in the morning. 90 tablet 3   losartan  (COZAAR ) 25 mg tablet Take 2 tablets (50 mg total) by mouth daily.     meloxicam (MOBIC) 15 mg tablet Take 15 mg by mouth daily. PRN     Mounjaro 12.5 mg/0.5 mL subcutaneous pen injector ADMINISTER  12.5 MG UNDER THE SKIN EVERY 7 DAYS 2 mL 5   omeprazole (PriLOSEC) 40 mg DR capsule Take 1 capsule (40 mg total) by mouth 2 (two) times a day. 180 capsule 3   Sunosi  150 mg tablet TAKE 1/2 TABLET BY MOUTH EVERY MORNING FOR 4 DAYS. MAY INCREASE TO 1 BY MOUTH EVERY MORNING     traMADoL  (ULTRAM ) 50 mg tablet Take 50-100 mg by mouth. (Patient not taking: Reported on 08/05/2024)     No  facility-administered encounter medications on file as of 08/05/2024.

## 2024-08-19 ENCOUNTER — Ambulatory Visit

## 2024-08-19 ENCOUNTER — Ambulatory Visit: Attending: Rheumatology | Admitting: Rheumatology

## 2024-08-19 ENCOUNTER — Encounter: Payer: Self-pay | Admitting: Rheumatology

## 2024-08-19 VITALS — BP 134/78 | HR 88 | Temp 97.5°F | Resp 16 | Ht 63.0 in | Wt 172.0 lb

## 2024-08-19 DIAGNOSIS — M79641 Pain in right hand: Secondary | ICD-10-CM

## 2024-08-19 DIAGNOSIS — M7062 Trochanteric bursitis, left hip: Secondary | ICD-10-CM | POA: Diagnosis not present

## 2024-08-19 DIAGNOSIS — Z96653 Presence of artificial knee joint, bilateral: Secondary | ICD-10-CM | POA: Diagnosis not present

## 2024-08-19 DIAGNOSIS — M79642 Pain in left hand: Secondary | ICD-10-CM

## 2024-08-19 DIAGNOSIS — I10 Essential (primary) hypertension: Secondary | ICD-10-CM

## 2024-08-19 DIAGNOSIS — M79672 Pain in left foot: Secondary | ICD-10-CM

## 2024-08-19 DIAGNOSIS — M791 Myalgia, unspecified site: Secondary | ICD-10-CM | POA: Diagnosis not present

## 2024-08-19 DIAGNOSIS — M79671 Pain in right foot: Secondary | ICD-10-CM

## 2024-08-19 DIAGNOSIS — M7061 Trochanteric bursitis, right hip: Secondary | ICD-10-CM

## 2024-08-19 DIAGNOSIS — R5383 Other fatigue: Secondary | ICD-10-CM

## 2024-08-19 DIAGNOSIS — F411 Generalized anxiety disorder: Secondary | ICD-10-CM | POA: Diagnosis not present

## 2024-08-19 DIAGNOSIS — M2550 Pain in unspecified joint: Secondary | ICD-10-CM | POA: Diagnosis not present

## 2024-08-19 DIAGNOSIS — E039 Hypothyroidism, unspecified: Secondary | ICD-10-CM

## 2024-08-19 DIAGNOSIS — G2581 Restless legs syndrome: Secondary | ICD-10-CM

## 2024-08-19 DIAGNOSIS — F5105 Insomnia due to other mental disorder: Secondary | ICD-10-CM | POA: Diagnosis not present

## 2024-08-19 DIAGNOSIS — F3163 Bipolar disorder, current episode mixed, severe, without psychotic features: Secondary | ICD-10-CM

## 2024-08-19 DIAGNOSIS — Z87891 Personal history of nicotine dependence: Secondary | ICD-10-CM

## 2024-08-19 DIAGNOSIS — F99 Mental disorder, not otherwise specified: Secondary | ICD-10-CM

## 2024-08-19 DIAGNOSIS — G4733 Obstructive sleep apnea (adult) (pediatric): Secondary | ICD-10-CM

## 2024-08-19 MED ORDER — LIDOCAINE HCL 1 % IJ SOLN
1.5000 mL | INTRAMUSCULAR | Status: AC | PRN
  Administered 2024-08-19: 1.5 mL

## 2024-08-19 MED ORDER — TRIAMCINOLONE ACETONIDE 40 MG/ML IJ SUSP
40.0000 mg | INTRAMUSCULAR | Status: AC | PRN
  Administered 2024-08-19: 40 mg via INTRA_ARTICULAR

## 2024-08-19 NOTE — Patient Instructions (Signed)
 Iliotibial Band Syndrome Rehab Ask your health care provider which exercises are safe for you. Do exercises exactly as told by your provider and adjust them as told. It's normal to feel mild stretching, pulling, tightness, or discomfort as you do these exercises. Stop right away if you feel sudden pain or your pain gets a lot worse. Do not begin these exercises until told by your provider. Stretching and range-of-motion exercises These exercises warm up your muscles and joints. They also improve the movement and flexibility of your hip and pelvis. Quadriceps stretch, prone  Lie face down (prone) on a firm surface like a bed or padded floor. Bend your left / right knee. Reach back to hold your ankle or pant leg. If you can't reach your ankle or pant leg, use a belt looped around your foot and grab the belt instead. Gently pull your heel toward your butt. Your knee should not slide out to the side. You should feel a stretch in the front of your thigh and knee, also called the quadriceps. Hold this position for __________ seconds. Repeat __________ times. Complete this exercise __________ times a day. Iliotibial band stretch The iliotibial band is a strip of tissue that runs along the outside of your hip down to your knee. Lie on your side with your left / right leg on top. Bend both knees and grab your left / right ankle. Stretch out your bottom arm to help you balance. Slowly bring your top knee back so your thigh goes behind your back. Slowly lower your top leg toward the floor until you feel a gentle stretch on the outside of your left / right hip and thigh. If you don't feel a stretch and your knee won't go farther, place the heel of your other foot on top of your knee and pull your knee down toward the floor with your foot. Hold this position for __________ seconds. Repeat __________ times. Complete this exercise __________ times a day. Strengthening exercises These exercises build strength  and endurance in your hip and pelvis. Endurance means your muscles can keep working even when they're tired. Straight leg raises, side-lying This exercise strengthens the muscles that rotate the leg at the hip and move it away from your body. These muscles are called hip abductors. Lie on your side with your left / right leg on top. Lie so your head, shoulder, hip, and knee line up. You can bend your bottom knee to help you balance. Roll your hips slightly forward so they're stacked directly over each other. Your left / right knee should face forward. Tense the muscles in your outer thigh and hip. Lift your top leg 4-6 inches (10-15 cm) off the ground. Hold this position for __________ seconds. Slowly lower your leg back down to the starting position. Let your muscles fully relax before doing this exercise again. Repeat __________ times. Complete this exercise __________ times a day. Leg raises, prone This exercise strengthens the muscles that move the hips backward. These muscles are called hip extensors. Lie face down (prone) on your bed or a firm surface. You can put a pillow under your hips for comfort and to support your lower back. Bend your left / right knee so your foot points straight up toward the ceiling. Keep the other leg straight and behind you. Squeeze your butt muscles. Lift your left / right thigh off the firm surface. Do not let your back arch. Tense your thigh muscle as hard as you can without having  more knee pain. Hold this position for __________ seconds. Slowly lower your leg to the starting position. Allow your leg to relax all the way. Repeat __________ times. Complete this exercise __________ times a day. Hip hike  Stand sideways on a bottom step. Place your feet so that your left / right leg is on the step, and the other foot is hanging off the side. If you need support for balance, hold onto a railing or wall. Keep your knees straight and your abdomen square,  meaning your hips are level. Then, lift your left / right hip up toward the ceiling. Slowly let your leg that's hanging off the step lower towards the floor. Your foot should get closer to the ground. Do not lean or bend your knees during this movement. Repeat __________ times. Complete this exercise __________ times a day. This information is not intended to replace advice given to you by your health care provider. Make sure you discuss any questions you have with your health care provider. Document Revised: 10/24/2022 Document Reviewed: 10/24/2022 Elsevier Patient Education  2024 [employer] Hand Exercises Hand exercises can be helpful for almost anyone. They can strengthen your hands and improve flexibility and movement. The exercises can also increase blood flow to the hands. These results can make your work and daily tasks easier for you. Hand exercises can be especially helpful for people who have joint pain from arthritis or nerve damage from using their hands over and over. These exercises can also help people who injure a hand. Exercises Most of these hand exercises are gentle stretching and motion exercises. It is usually safe to do them often throughout the day. Warming up your hands before exercise may help reduce stiffness. You can do this with gentle massage or by placing your hands in warm water for 10-15 minutes. It is normal to feel some stretching, pulling, tightness, or mild discomfort when you begin new exercises. In time, this will improve. Remember to always be careful and stop right away if you feel sudden, very bad pain or your pain gets worse. You want to get better and be safe. Ask your health care provider which exercises are safe for you. Do exercises exactly as told by your provider and adjust them as told. Do not begin these exercises until told by your provider. Knuckle bend or claw fist  Stand or sit with your arm, hand, and all five fingers pointed straight up.  Make sure to keep your wrist straight. Gently bend your fingers down toward your palm until the tips of your fingers are touching your palm. Keep your big knuckle straight and only bend the small knuckles in your fingers. Hold this position for 10 seconds. Straighten your fingers back to your starting position. Repeat this exercise 5-10 times with each hand. Full finger fist  Stand or sit with your arm, hand, and all five fingers pointed straight up. Make sure to keep your wrist straight. Gently bend your fingers into your palm until the tips of your fingers are touching the middle of your palm. Hold this position for 10 seconds. Extend your fingers back to your starting position, stretching every joint fully. Repeat this exercise 5-10 times with each hand. Straight fist  Stand or sit with your arm, hand, and all five fingers pointed straight up. Make sure to keep your wrist straight. Gently bend your fingers at the big knuckle, where your fingers meet your hand, and at the middle knuckle. Keep the knuckle at the  tips of your fingers straight and try to touch the bottom of your palm. Hold this position for 10 seconds. Extend your fingers back to your starting position, stretching every joint fully. Repeat this exercise 5-10 times with each hand. Tabletop  Stand or sit with your arm, hand, and all five fingers pointed straight up. Make sure to keep your wrist straight. Gently bend your fingers at the big knuckle, where your fingers meet your hand, as far down as you can. Keep the small knuckles in your fingers straight. Think of forming a tabletop with your fingers. Hold this position for 10 seconds. Extend your fingers back to your starting position, stretching every joint fully. Repeat this exercise 5-10 times with each hand. Finger spread  Place your hand flat on a table with your palm facing down. Make sure your wrist stays straight. Spread your fingers and thumb apart from each other  as far as you can until you feel a gentle stretch. Hold this position for 10 seconds. Bring your fingers and thumb tight together again. Hold this position for 10 seconds. Repeat this exercise 5-10 times with each hand. Making circles  Stand or sit with your arm, hand, and all five fingers pointed straight up. Make sure to keep your wrist straight. Make a circle by touching the tip of your thumb to the tip of your index finger. Hold for 10 seconds. Then open your hand wide. Repeat this motion with your thumb and each of your fingers. Repeat this exercise 5-10 times with each hand. Thumb motion  Sit with your forearm resting on a table and your wrist straight. Your thumb should be facing up toward the ceiling. Keep your fingers relaxed as you move your thumb. Lift your thumb up as high as you can toward the ceiling. Hold for 10 seconds. Bend your thumb across your palm as far as you can, reaching the tip of your thumb for the small finger (pinkie) side of your palm. Hold for 10 seconds. Repeat this exercise 5-10 times with each hand. Grip strengthening  Hold a stress ball or other soft ball in the middle of your hand. Slowly increase the pressure, squeezing the ball as much as you can without causing pain. Think of bringing the tips of your fingers into the middle of your palm. All of your finger joints should bend when doing this exercise. Hold your squeeze for 10 seconds, then relax. Repeat this exercise 5-10 times with each hand. Contact a health care provider if: Your hand pain or discomfort gets much worse when you do an exercise. Your hand pain or discomfort does not improve within 2 hours after you exercise. If you have either of these problems, stop doing these exercises right away. Do not do them again unless your provider says that you can. Get help right away if: You develop sudden, severe hand pain or swelling. If this happens, stop doing these exercises right away. Do not do  them again unless your provider says that you can. This information is not intended to replace advice given to you by your health care provider. Make sure you discuss any questions you have with your health care provider. Document Revised: 08/26/2022 Document Reviewed: 08/26/2022 Elsevier Patient Education  2024 [employer].

## 2024-08-21 ENCOUNTER — Ambulatory Visit: Payer: Self-pay | Admitting: Rheumatology

## 2024-08-21 NOTE — Progress Notes (Signed)
 CBC normal, CMP normal, CK normal, uric acid normal, rheumatoid factor negative, ANA negative, anti-CCP pending.

## 2024-08-22 LAB — COMPREHENSIVE METABOLIC PANEL WITH GFR
AG Ratio: 1.4 (calc) (ref 1.0–2.5)
ALT: 10 U/L (ref 6–29)
AST: 12 U/L (ref 10–35)
Albumin: 3.8 g/dL (ref 3.6–5.1)
Alkaline phosphatase (APISO): 61 U/L (ref 37–153)
BUN: 15 mg/dL (ref 7–25)
CO2: 29 mmol/L (ref 20–32)
Calcium: 8.9 mg/dL (ref 8.6–10.4)
Chloride: 103 mmol/L (ref 98–110)
Creat: 0.54 mg/dL (ref 0.50–1.05)
Globulin: 2.7 g/dL (ref 1.9–3.7)
Glucose, Bld: 98 mg/dL (ref 65–99)
Potassium: 4.1 mmol/L (ref 3.5–5.3)
Sodium: 138 mmol/L (ref 135–146)
Total Bilirubin: 0.3 mg/dL (ref 0.2–1.2)
Total Protein: 6.5 g/dL (ref 6.1–8.1)
eGFR: 103 mL/min/1.73m2

## 2024-08-22 LAB — RHEUMATOID FACTOR: Rheumatoid fact SerPl-aCnc: <10 [IU]/mL

## 2024-08-22 LAB — CBC WITH DIFFERENTIAL/PLATELET
Absolute Lymphocytes: 2528 {cells}/uL (ref 850–3900)
Absolute Monocytes: 436 {cells}/uL (ref 200–950)
Basophils Absolute: 73 {cells}/uL (ref 0–200)
Basophils Relative: 1.1 %
Eosinophils Absolute: 271 {cells}/uL (ref 15–500)
Eosinophils Relative: 4.1 %
HCT: 40.6 % (ref 35.9–46.0)
Hemoglobin: 13.3 g/dL (ref 11.7–15.5)
MCH: 27.8 pg (ref 27.0–33.0)
MCHC: 32.8 g/dL (ref 31.6–35.4)
MCV: 84.8 fL (ref 81.4–101.7)
MPV: 9.6 fL (ref 7.5–12.5)
Monocytes Relative: 6.6 %
Neutro Abs: 3293 {cells}/uL (ref 1500–7800)
Neutrophils Relative %: 49.9 %
Platelets: 337 Thousand/uL (ref 140–400)
RBC: 4.79 Million/uL (ref 3.80–5.10)
RDW: 12.6 % (ref 11.0–15.0)
Total Lymphocyte: 38.3 %
WBC: 6.6 Thousand/uL (ref 3.8–10.8)

## 2024-08-22 LAB — ANA: Anti Nuclear Antibody (ANA): NEGATIVE

## 2024-08-22 LAB — CYCLIC CITRUL PEPTIDE ANTIBODY, IGG: Cyclic Citrullin Peptide Ab: <16 U

## 2024-08-22 LAB — CK: Total CK: 42 U/L (ref 20–243)

## 2024-08-22 LAB — SEDIMENTATION RATE: Sed Rate: 6 mm/h (ref 0–30)

## 2024-08-22 LAB — URIC ACID: Uric Acid, Serum: 4.3 mg/dL (ref 2.5–7.0)

## 2024-09-04 ENCOUNTER — Other Ambulatory Visit: Payer: Self-pay

## 2024-09-04 ENCOUNTER — Ambulatory Visit
Admission: EM | Admit: 2024-09-04 | Discharge: 2024-09-04 | Disposition: A | Discharge status: Home / Self Care | Attending: Family Medicine | Admitting: Family Medicine

## 2024-09-04 ENCOUNTER — Encounter: Payer: Self-pay | Admitting: *Deleted

## 2024-09-04 DIAGNOSIS — B9689 Other specified bacterial agents as the cause of diseases classified elsewhere: Secondary | ICD-10-CM | POA: Diagnosis not present

## 2024-09-04 DIAGNOSIS — J019 Acute sinusitis, unspecified: Secondary | ICD-10-CM

## 2024-09-04 MED ORDER — AMOXICILLIN-POT CLAVULANATE 875-125 MG PO TABS: 1.0000 | ORAL_TABLET | Freq: Two times a day (BID) | ORAL | 0 refills | Status: AC

## 2024-09-04 NOTE — Discharge Instructions (Signed)
 Continue Flonase daily Drink lots of water Add Augmentin  2 times a day with food To your doctor if not better in a week

## 2024-09-04 NOTE — ED Triage Notes (Signed)
 Patient states 5 days of cough, congestion, facial pain, concerned she has sinus infection.  Feels she has low grade temp at night.  Taking OTC sinus med with no relief

## 2024-09-04 NOTE — ED Provider Notes (Signed)
 " Emma [name]    CSN: [id number] Arrival date & time: 09/04/24  1234      History   Chief Complaint Chief Complaint  Patient presents with   Nasal Congestion   Cough   Facial Pain    HPI Emma [name] is a 64 y.o. female.   Patient states she had sinus surgery many years ago for recurring sinusitis.  This left her difficulty breathing through her nose and the inability to blow her nose.  She states she has had recurring sinus infections ever since then.  She is here for what she feels is a sinus infection.  She has sinus congestion, postnasal drip, some runny nose, cough, yellow sputum, headache.  She has tried over-the-counter medications.     Past Medical History:  Diagnosis Date   Anxiety    Asthma    GERD (gastroesophageal reflux disease)    Hypertension    Hypothyroidism    PONV (postoperative nausea and vomiting)    Sleep apnea    non compliant with CPAP   Thyroid  disease     Patient Active Problem List   Diagnosis Date Noted   Primary hypertension 09/03/2021   Affective psychosis, bipolar (HCC) 07/22/2020   Irritability and anger 07/22/2020   Generalized anxiety disorder 07/22/2020   Insomnia due to other mental disorder 07/22/2020   Acquired hypothyroidism 03/13/2020   Periumbilical abdominal pain 03/13/2020    Past Surgical History:  Procedure Laterality Date   ABDOMINAL HYSTERECTOMY     bladder stimulator  11/2021   bladder stimulator removal   04/11/2024   CARPAL TUNNEL RELEASE Bilateral    CHOLECYSTECTOMY     COLONOSCOPY     DRUG INDUCED ENDOSCOPY Bilateral 03/03/2024   Procedure: DRUG INDUCED SLEEP ENDOSCOPY;  Surgeon: Carlie Golob, MD;  Location: [facility] ENDOSCOPY;  Service: ENT;  Laterality: Bilateral;   EYE SURGERY Bilateral    laser   IMPLANTATION OF HYPOGLOSSAL NERVE STIMULATOR Right 04/21/2024   Procedure: INSERTION, HYPOGLOSSAL NERVE STIMULATOR;  Surgeon: Carlie Spanbauer, MD;  Location: [facility];  Service: ENT;   Laterality: Right;   inspire for sleep apnea  04/21/2024   INTERSTIM IMPLANT REMOVAL N/A 07/12/2024   Procedure: REMOVAL, NEUROSTIMULATOR, SACRAL;  Surgeon: Gaston Hamilton, MD;  Location: [facility];  Service: Urology;  Laterality: N/A;   JOINT REPLACEMENT     NASAL SINUS SURGERY  2006   OSTEOTOMY Left 2007   foot   ROTATOR CUFF REPAIR Right    SHOULDER ARTHROSCOPY WITH SUBACROMIAL DECOMPRESSION, ROTATOR CUFF REPAIR AND BICEP TENDON REPAIR Left 04/01/2019   Procedure: LEFT SHOULDER ARTHROSCOPY WITH SUBACROMIAL DECOMPRESSION, DISTAL CLAVICLE EXCISION, BICEP TENODESIS, ROTATOR CUFF REPAIR, spinal needle decrompression of glenoid cyst;  Surgeon: Beverley Evalene BIRCH, MD;  Location: [facility];  Service: Orthopedics;  Laterality: Left;   TOTAL KNEE ARTHROPLASTY Bilateral     OB History   No obstetric history on file.      Home Medications    Prior to Admission medications  Medication Sig Start Date End Date Taking? Authorizing Provider  amoxicillin -clavulanate (AUGMENTIN ) 875-125 MG tablet Take 1 tablet by mouth every 12 (twelve) hours. 09/04/24  Yes Maranda Jamee Jacob, MD  Azelastine HCl 137 MCG/SPRAY SOLN Place 1 spray into the nose as needed. 08/10/24   [provider]  cetirizine (ZYRTEC) 10 MG tablet Take 10 mg by mouth daily. prn    [provider]  clonazePAM  (KLONOPIN ) 0.5 MG tablet Take 1 tablet (0.5 mg total) by mouth 2 (  two) times daily as needed for anxiety. 02/05/24   Rhys Verneita DASEN, PA-C  DULoxetine  (CYMBALTA ) 20 MG capsule Take 2 capsules (40 mg total) by mouth daily. 08/03/24   Rhys Verneita T, PA-C  estradiol (ESTRACE) 2 MG tablet Take 2 mg by mouth daily.     [provider]  eszopiclone  (LUNESTA ) 2 MG TABS tablet Take 1 tablet (2 mg total) by mouth at bedtime as needed for sleep. Take immediately before bedtime 08/03/24   Rhys Verneita T, PA-C  fluticasone (FLONASE) 50 MCG/ACT nasal spray Place 1 spray into both nostrils daily as  needed for allergies. 10/04/19   [provider]  hydrochlorothiazide (HYDRODIURIL) 12.5 MG tablet Take 12.5 mg by mouth daily. Patient not taking: Reported on 08/19/2024    [provider]  HYDROcodone -acetaminophen  (NORCO/VICODIN) 5-325 MG tablet Take 1-2 tablets by mouth every 6 (six) hours as needed for moderate pain (pain score 4-6). Patient not taking: Reported on 08/19/2024 07/12/24   MacDiarmid, Scott, MD  levothyroxine  (SYNTHROID ) 50 MCG tablet Take 1 tablet (50 mcg total) by mouth daily before breakfast. 09/03/21   Shamleffer, Ibtehal Jaralla, MD  losartan -hydrochlorothiazide (HYZAAR) 50-12.5 MG tablet Take 1 tablet by mouth daily. 08/12/24 08/12/25  [provider]  meloxicam (MOBIC) 15 MG tablet Take 15 mg by mouth daily as needed for pain.    [provider]  naproxen sodium (ALEVE) 220 MG tablet Take 220 mg by mouth every 6 (six) hours.    [provider]  omeprazole (PRILOSEC) 40 MG capsule Take 40 mg by mouth daily.    [provider]  ondansetron  (ZOFRAN -ODT) 4 MG disintegrating tablet Take 4 mg by mouth every 8 (eight) hours as needed. 08/05/24   [provider]  OVER THE COUNTER MEDICATION Take 1 tablet by mouth daily at 12 noon. Calcium, Vitamin D, Vitamin K, and Vitamin B12    [provider]  promethazine  (PHENERGAN ) 25 MG tablet Take 1 tablet (25 mg total) by mouth every 6 (six) hours as needed for nausea or vomiting. 02/06/22   Emil Share, DO  Solriamfetol  HCl (SUNOSI ) 150 MG TABS Take 1 tablet (150 mg total) by mouth every morning. Patient taking differently: Take 150 mg by mouth as needed. 08/03/24   Rhys Verneita T, PA-C  SUMATRIPTAN SUCCINATE PO Take 1 tablet by mouth daily as needed (migraines). Patient not taking: Reported on 08/19/2024    [provider]  tirzepatide [facility]) 12.5 MG/0.5ML Pen Inject 12.5 mg into the skin once a week.    [provider]  UNABLE TO FIND  CPAP Patient not taking: Reported on 08/19/2024    [provider]  UNABLE TO FIND Inspire implanted 04/21/2024.    [provider]  Vitamin E 400 units TABS Take 400 Units by mouth daily.    [provider]    Family History Family History  Problem Relation Age of Onset   Breast cancer Mother 56   Dementia Mother    Drug abuse Mother    Stomach cancer Father    Heart disease Father    Arthritis Father    Breast cancer Sister 5   Hypertension Sister    Obesity Sister    Heart Problems Sister    Hypertension Sister    Diabetes Sister    Heart Problems Sister    Obesity Sister    Hypertension Brother    Diabetes Brother    Heart Problems Brother    Drug abuse Brother  Obesity Brother    Hypertension Brother    Heart Problems Brother    Breast cancer Paternal Grandmother    Healthy Daughter    Healthy Son     Social History Social History[1]   Allergies   Latuda  [lurasidone ], Molds & smuts, Oxcarbazepine , Quercus robur, White oak, Codeine, Prednisone, and Statins   Review of Systems Review of Systems See HPI  Physical Exam Triage Vital Signs ED Triage Vitals  Encounter Vitals Group     BP 09/04/24 1320 136/83     Girls Systolic BP Percentile --      Girls Diastolic BP Percentile --      Boys Systolic BP Percentile --      Boys Diastolic BP Percentile --      Pulse Rate 09/04/24 1320 100     Resp 09/04/24 1320 20     Temp 09/04/24 1320 97.8 F (36.6 C)     Temp Source 09/04/24 1320 Oral     SpO2 09/04/24 1320 97 %     Weight 09/04/24 1318 168 lb 1.6 oz (76.2 kg)     Height --      Head Circumference --      Peak Flow --      Pain Score 09/04/24 1317 6     Pain Loc --      Pain Education --      Exclude from Growth Chart --    No data found.  Updated Vital Signs BP 136/83 (BP Location: Right Arm)   Pulse 100   Temp 97.8 F (36.6 C) (Oral)   Resp 20   Wt 76.2 kg   SpO2 97%   BMI 29.78 kg/m       Physical  Exam Constitutional:      General: She is not in acute distress.    Appearance: She is well-developed. She is ill-appearing.  HENT:     Head: Normocephalic and atraumatic.     Right Ear: Tympanic membrane normal.     Left Ear: Tympanic membrane normal.     Nose: Congestion and rhinorrhea present.     Comments: Nasal membranes swollen shut.  Narrow nasal passages.  Tender facial sinuses.    Mouth/Throat:     Mouth: Mucous membranes are moist.     Pharynx: Posterior oropharyngeal erythema present.  Eyes:     Conjunctiva/sclera: Conjunctivae normal.     Pupils: Pupils are equal, round, and reactive to light.  Cardiovascular:     Rate and Rhythm: Normal rate.     Heart sounds: Normal heart sounds.  Pulmonary:     Effort: Pulmonary effort is normal. No respiratory distress.     Breath sounds: Normal breath sounds.  Musculoskeletal:        General: Normal range of motion.     Cervical back: Normal range of motion.  Skin:    General: Skin is warm and dry.  Neurological:     Mental Status: She is alert.      UC Treatments / Results  Labs (all labs ordered are listed, but only abnormal results are displayed) Labs Reviewed - No data to display  EKG   Radiology No results found.  Procedures Procedures (including critical [name] time)  Medications Ordered in UC Medications - No data to display  Initial Impression / Assessment and Plan / UC Course  I have reviewed the triage vital signs and the nursing notes.  Pertinent labs & imaging results that were available during my [name] of the  patient were reviewed by me and considered in my medical decision making (see chart for details).     Final Clinical Impressions(s) / UC Diagnoses   Final diagnoses:  Acute bacterial sinusitis     Discharge Instructions      Continue Flonase daily Drink lots of water Add Augmentin  2 times a day with food To your doctor if not better in a week   ED Prescriptions     Medication  Sig Dispense Auth. Provider   amoxicillin -clavulanate (AUGMENTIN ) 875-125 MG tablet Take 1 tablet by mouth every 12 (twelve) hours. 14 tablet Maranda Jamee Jacob, MD      PDMP not reviewed this encounter.    [1]  Social History Tobacco Use   Smoking status: Former    Current packs/day: 0.00    Types: Cigarettes    Quit date: 07/17/2009    Years since quitting: 15.1    Passive exposure: Past   Smokeless tobacco: Never  Vaping Use   Vaping status: Never Used  Substance Use Topics   Alcohol use: Yes    Comment: 1 q every 4 months.   Drug use: Never     Maranda Jamee Jacob, MD 09/04/24 1342  "

## 2024-09-08 ENCOUNTER — Other Ambulatory Visit: Payer: Self-pay | Admitting: Physician Assistant

## 2024-09-09 ENCOUNTER — Other Ambulatory Visit: Payer: Self-pay

## 2024-09-09 ENCOUNTER — Telehealth: Payer: Self-pay | Admitting: Physician Assistant

## 2024-09-09 MED ORDER — DULOXETINE HCL 20 MG PO CPEP: 40.0000 mg | ORAL_CAPSULE | Freq: Every day | ORAL | 0 refills | Status: AC

## 2024-09-09 NOTE — Telephone Encounter (Signed)
 Pt LVM @ 8:13a stating that [facility] doesn't have her Duloxetine .  She would like it transferred to [facility] on [address], [place].  Next appt 4/13

## 2024-11-03 ENCOUNTER — Ambulatory Visit: Admitting: Rheumatology

## 2024-12-05 ENCOUNTER — Ambulatory Visit: Admitting: Physician Assistant
